# Patient Record
Sex: Male | Born: 1970 | Race: White | Hispanic: No | Marital: Single | Smoking: Never smoker
Health system: Southern US, Community
[De-identification: ages and names within clinical notes are randomized; demographics above are authoritative.]

## PROBLEM LIST (undated history)

## (undated) DIAGNOSIS — J45909 Unspecified asthma, uncomplicated: Secondary | ICD-10-CM

## (undated) HISTORY — PX: LITHOTRIPSY: SUR834

## (undated) HISTORY — PX: CARDIAC VALVE SURGERY: SHX40

## (undated) HISTORY — PX: TONSILLECTOMY: SUR1361

---

## 2002-02-19 ENCOUNTER — Encounter: Payer: Self-pay | Admitting: Emergency Medicine

## 2002-02-19 ENCOUNTER — Emergency Department (HOSPITAL_COMMUNITY): Admission: EM | Admit: 2002-02-19 | Discharge: 2002-02-19 | Payer: Self-pay | Admitting: Emergency Medicine

## 2003-11-02 ENCOUNTER — Emergency Department (HOSPITAL_COMMUNITY): Admission: EM | Admit: 2003-11-02 | Discharge: 2003-11-02 | Payer: Self-pay

## 2003-11-03 ENCOUNTER — Emergency Department (HOSPITAL_COMMUNITY): Admission: EM | Admit: 2003-11-03 | Discharge: 2003-11-03 | Payer: Self-pay | Admitting: Emergency Medicine

## 2010-05-01 ENCOUNTER — Ambulatory Visit: Payer: Self-pay | Admitting: Family Medicine

## 2010-05-01 DIAGNOSIS — R011 Cardiac murmur, unspecified: Secondary | ICD-10-CM

## 2010-05-01 DIAGNOSIS — I38 Endocarditis, valve unspecified: Secondary | ICD-10-CM | POA: Insufficient documentation

## 2010-05-01 DIAGNOSIS — E669 Obesity, unspecified: Secondary | ICD-10-CM

## 2010-05-01 LAB — CONVERTED CEMR LAB
ALT: 31 units/L (ref 0–53)
AST: 23 units/L (ref 0–37)
Albumin: 3.9 g/dL (ref 3.5–5.2)
Alkaline Phosphatase: 72 units/L (ref 39–117)
BUN: 15 mg/dL (ref 6–23)
CO2: 26 meq/L (ref 19–32)
Calcium: 9.2 mg/dL (ref 8.4–10.5)
Chloride: 103 meq/L (ref 96–112)
Cholesterol: 206 mg/dL — ABNORMAL HIGH (ref 0–200)
Creatinine, Ser: 1.11 mg/dL (ref 0.40–1.50)
Glucose, Bld: 92 mg/dL (ref 70–99)
HCT: 44.2 % (ref 39.0–52.0)
HDL: 34 mg/dL — ABNORMAL LOW (ref >39)
Hemoglobin: 14.4 g/dL (ref 13.0–17.0)
LDL Cholesterol: 149 mg/dL — ABNORMAL HIGH (ref 0–99)
MCHC: 32.6 g/dL (ref 30.0–36.0)
MCV: 84.8 fL (ref 78.0–100.0)
Platelets: 227 10*3/uL (ref 150–400)
Potassium: 4.2 meq/L (ref 3.5–5.3)
RBC: 5.21 M/uL (ref 4.22–5.81)
RDW: 13.5 % (ref 11.5–15.5)
Sodium: 139 meq/L (ref 135–145)
Total Bilirubin: 0.4 mg/dL (ref 0.3–1.2)
Total CHOL/HDL Ratio: 6.1
Total Protein: 7 g/dL (ref 6.0–8.3)
Triglycerides: 117 mg/dL (ref <150)
VLDL: 23 mg/dL (ref 0–40)
WBC: 7 10*3/uL (ref 4.0–10.5)

## 2010-05-02 ENCOUNTER — Encounter: Payer: Self-pay | Admitting: Family Medicine

## 2010-05-10 ENCOUNTER — Encounter: Payer: Self-pay | Admitting: Family Medicine

## 2010-05-10 ENCOUNTER — Ambulatory Visit: Payer: Self-pay | Admitting: Cardiology

## 2010-05-10 ENCOUNTER — Encounter (INDEPENDENT_AMBULATORY_CARE_PROVIDER_SITE_OTHER): Payer: Self-pay | Admitting: Diagnostic Radiology

## 2010-05-10 ENCOUNTER — Ambulatory Visit (HOSPITAL_COMMUNITY): Admission: RE | Admit: 2010-05-10 | Discharge: 2010-05-10 | Payer: Self-pay | Admitting: Family Medicine

## 2010-05-21 ENCOUNTER — Encounter: Payer: Self-pay | Admitting: Family Medicine

## 2010-09-27 NOTE — Assessment & Plan Note (Signed)
Summary: NP,tcb   Vital Signs:  Patient profile:   40 year old male Height:      67.25 inches Weight:      303 pounds BMI:     47.27 Temp:     98.3 degrees F oral Pulse rate:   101 / minute BP sitting:   132 / 87  (left arm) Cuff size:   large  Vitals Entered By: Tessie Fass CMA (May 01, 2010 10:00 AM) CC: NEW PT Is Patient Diabetic? No Pain Assessment Patient in pain? no        CC:  NEW PT.  History of Present Illness: Patient is new to our office.  He is here with his stepmother, for evaluation prior to dental extraction of L upper molar.  He has history of cardiac valvular surgery at age 22, has not required further followup and needs note for dentist to advise on prophylaxis for endocarditis before dental procedure.  No other problems or concerns.   Denies chest pain,dyspnea, cough, swelling in legs.  Denies weight changes, fevers chills or sweats.    Has been relatively well.   His dentist is [facility], [address], [address].     Habits & Providers  Alcohol-Tobacco-Diet     Tobacco Status: never  Past History:  Past Medical History: "Heart murmur" from birth/infancy.  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   Past Surgical History: Heart valve surgery at age 79; unclear diagnosis or procedure, nor where surgery was done.   Family History: Father with DM and HTN.  Mother died of unspecified cancer.   Social History: 5-8 yrs of education.  LIves with father, step-mother.  Biological mother died of unknown cancer.  Stays at home and watches TV.  Very little structure in his life.  Never a smoker, never alcohol or drugs. Smoking Status:  never  Physical Exam  General:  Pleasant, no apparent distress. Obese-appearing  Eyes:  clear sclerae Mouth:  clear oropharynx.  MMM Neck:  necksupple. no JVD Lungs:  Normal respiratory effort, chest expands symmetrically. Lungs are clear to auscultation, no crackles or  wheezes. Heart:  regular S1S2, has loud systolic M heard throughout Abdomen:  obese, no fluid wave.  Nontender, nondistended.  Pulses:  palpable dp pulses bilat.  Extremities:  no ankle edema.    Impression & Recommendations:  Problem # 1:  VALVULAR HEART DISEASE (ICD-424.90) Unspecified valvular heart disease. murmur is heard on exam. Will order ECHO for evaluation, and will send Rx for prophylaxis with amoxicillin.  Form from [facility] completed and given back to patient's step=mother.  Orders: Comp Met-[facility] ([id number]) 2 D Echo (2 D Echo)  Problem # 2:  OBESITY, UNSPECIFIED (ICD-278.00) Screening for lipid disorders in this obese patient.  Orders: Comp Met-[facility] [phone]) CBC-[facility] (78469) Lipid-[facility] ([id number])  Complete Medication List: 1)  Amoxicillin 500 Mg Caps (Amoxicillin) .... Sig: take 4 capsules by mouth one-time only, about 30 to 60 minutes before dental extraction.  Patient Instructions: 1)  It was a pleasure to see you today.  2)  Because of your history of valvular heart surgery, I am recommending you to take Amoxicillin 2g, one-time only before your dental procedure (30 to 60 minutes before).   You do not need to take more than the one dose. 3)  I am ordering an ECHO of your heart, to assess the murmur. 4)  I would like to see you after the ECHO is done and  I have a result.  5)  I will contact you with the results of your labwork, which we are drawing today.  Prescriptions: AMOXICILLIN 500 MG CAPS (AMOXICILLIN) SIG: Take 4 capsules by mouth one-time only, about 30 to 60 minutes before dental extraction.  #4 x 1   Entered and Authorized by:   Paula Compton MD   Signed by:   Paula Compton MD on 05/01/2010   Method used:   Electronically to        [facility]. [phone]* (retail)       [address]       [place], [place]  [address]       Ph: [phone]       Fax: [phone]   RxID:   [id number]

## 2010-09-27 NOTE — Letter (Signed)
Summary: Generic Letter  [facility]  [address]   [place], [place] [address]   Phone: [phone]  Fax: [phone]    05/02/2010  Ronald [name] [address] [place], [place]  [address]  Dear Mr. Colbaugh,    I write with results of the lab tests we did at your visit this week.  The tests were unremarkable, with the only abnormality being a very mildly elevated LDL "bad" cholesterol level.  This does not need to be treated with medication.  I advise a low-cholesterol, low-fat diet.  Please call with any questions or concerns.     Sincerely,   Paula Compton MD

## 2010-09-27 NOTE — Consult Note (Signed)
Summary: [facility]  [facility]   Imported By: Clydell Hakim 05/17/2010 15:19:56  _____________________________________________________________________  External Attachment:    Type:   Image     Comment:   External Document

## 2010-09-27 NOTE — Letter (Signed)
Summary: Generic Letter  [facility]  [address]   [place], [place] [address]   Phone: [phone]  Fax: [phone]    05/21/2010  Ronald [name] [address] [place], [place]  [address]  Dear [name],   I hope this letter finds you well.  I write because I received the result of the echocardiography.  The results are normal; based on the results, you do not require antibiotics before dental procedures.   Please feel free to contact me if you have any questions or concerns.     Sincerely,   Paula Compton MD  Appended Document: Generic Letter mailed.

## 2010-10-19 ENCOUNTER — Encounter: Payer: Self-pay | Admitting: *Deleted

## 2012-02-02 ENCOUNTER — Emergency Department (HOSPITAL_BASED_OUTPATIENT_CLINIC_OR_DEPARTMENT_OTHER)
Admission: EM | Admit: 2012-02-02 | Discharge: 2012-02-02 | Disposition: A | Discharge status: Home / Self Care | Payer: Medicare Other | Attending: Emergency Medicine | Admitting: Emergency Medicine

## 2012-02-02 ENCOUNTER — Encounter (HOSPITAL_BASED_OUTPATIENT_CLINIC_OR_DEPARTMENT_OTHER): Payer: Self-pay | Admitting: *Deleted

## 2012-02-02 DIAGNOSIS — T148XXA Other injury of unspecified body region, initial encounter: Secondary | ICD-10-CM

## 2012-02-02 DIAGNOSIS — R109 Unspecified abdominal pain: Secondary | ICD-10-CM | POA: Insufficient documentation

## 2012-02-02 HISTORY — DX: Unspecified asthma, uncomplicated: J45.909

## 2012-02-02 LAB — COMPREHENSIVE METABOLIC PANEL
BUN: 17 mg/dL (ref 6–23)
CO2: 29 mEq/L (ref 19–32)
Calcium: 10 mg/dL (ref 8.4–10.5)
Chloride: 103 mEq/L (ref 96–112)
Creatinine, Ser: 1.1 mg/dL (ref 0.50–1.35)
GFR calc Af Amer: >90 mL/min (ref >90)
GFR calc non Af Amer: 83 mL/min — ABNORMAL LOW (ref >90)
Glucose, Bld: 100 mg/dL — ABNORMAL HIGH (ref 70–99)
Total Bilirubin: 0.2 mg/dL — ABNORMAL LOW (ref 0.3–1.2)

## 2012-02-02 LAB — URINALYSIS, ROUTINE W REFLEX MICROSCOPIC
Hgb urine dipstick: NEGATIVE
Nitrite: NEGATIVE
Protein, ur: NEGATIVE mg/dL
Specific Gravity, Urine: 1.024 (ref 1.005–1.030)
Urobilinogen, UA: 0.2 mg/dL (ref 0.0–1.0)

## 2012-02-02 LAB — CBC
HCT: 44.6 % (ref 39.0–52.0)
Hemoglobin: 15.2 g/dL (ref 13.0–17.0)
MCV: 80.5 fL (ref 78.0–100.0)
WBC: 9.9 10*3/uL (ref 4.0–10.5)

## 2012-02-02 LAB — DIFFERENTIAL
Eosinophils Relative: 3 % (ref 0–5)
Lymphocytes Relative: 24 % (ref 12–46)
Monocytes Absolute: 0.7 10*3/uL (ref 0.1–1.0)
Monocytes Relative: 7 % (ref 3–12)
Neutro Abs: 6.5 10*3/uL (ref 1.7–7.7)

## 2012-02-02 LAB — LIPASE, BLOOD: Lipase: 12 U/L (ref 11–59)

## 2012-02-02 MED ORDER — HYDROCODONE-ACETAMINOPHEN 5-325 MG PO TABS: 2.0000 | ORAL_TABLET | ORAL | Status: AC | PRN

## 2012-02-02 MED ORDER — CYCLOBENZAPRINE HCL 10 MG PO TABS: 10.0000 mg | ORAL_TABLET | Freq: Two times a day (BID) | ORAL | Status: AC | PRN

## 2012-02-02 MED ORDER — IBUPROFEN 800 MG PO TABS: 800.0000 mg | ORAL_TABLET | Freq: Three times a day (TID) | ORAL | Status: AC

## 2012-02-02 NOTE — Discharge Instructions (Signed)
Muscle Strain A muscle strain, or pulled muscle, occurs when a muscle is over-stretched. A small number of muscle fibers may also be torn. This is especially common in athletes. This happens when a sudden violent force placed on a muscle pushes it past its capacity. Usually, recovery from a pulled muscle takes 1 to 2 weeks. But complete healing will take 5 to 6 weeks. There are millions of muscle fibers. Following injury, your body will usually return to normal quickly. HOME CARE INSTRUCTIONS   While awake, apply ice to the sore muscle for 15 to 20 minutes each hour for the first 2 days. Put ice in a plastic bag and place a towel between the bag of ice and your skin.   Do not use the pulled muscle for several days. Do not use the muscle if you have pain.   You may wrap the injured area with an elastic bandage for comfort. Be careful not to bind it too tightly. This may interfere with blood circulation.   Only take over-the-counter or prescription medicines for pain, discomfort, or fever as directed by your caregiver. Do not use aspirin as this will increase bleeding (bruising) at injury site.   Warming up before exercise helps prevent muscle strains.  Go to www.goodrx.com to look up your medications. This will give you a list of where you can find your prescriptions at the most affordable prices.   Call [facility]  [phone]  If you have no primary doctor, here are some resources that may be helpful:  Medicaid-accepting [facility] Providers:   - [facility]- [address]      [phone]      Mon-Fri 9am-7pm, Sat 9am-1pm   - [facility]- [address] [place]      [phone]   - [facility]- [address], [address] [place]      [phone]   [facility]- [address]      [phone]   - Renaye Rakers- [address] [place], [address]      [phone]      Only accepts [place]  patients       after they have her name applied to their card   Self Pay (no insurance) in [place]:   - Sickle Cell Patients: Dr Willey Blade, [facility]      [address] [place]      [phone]   - [facility] [phone]   - Physician Referral Service- [phone]   - [facility]- [address] [place]      [phone]   [facility] [place]- [address]   - [facility]- see information above      (Speak to [employer] if you do not have insurance)   - [facility]- [address] [place]      [phone]   - [facility] [place]- [address]      [phone]   - [facility]- [address]      [phone]   - Dr Julio Sicks-  [address], [place]      [phone]   - [facility]- [address]      [phone]   - [facility]- [address]      [phone]  Also [address]      [phone]   - [facility]- [address]      [phone]      1st and 3rd Saturday every month, 10am-1pm    Other agencies that provide inexpensive medical care:     [facility]  [phone]    [facility]  [phone]    [facility]  [phone]    [facility]  [phone] [address] [place] [address]    [facility]  [phone]    [facility]  [phone] [facility] [phone]   [address] [place], [place] [address]  Chronic Pain Problems Contact [facility]  [phone] Patients need to be referred by their primary care doctor.  [facility]  [facility]     [employer]                          [facility] Dept. [address] [place]                       [address]      [address] [place] [address]   [phone] (After Hours)  General Information: Finding a  doctor when you do not have health insurance can be tricky. Although you are not limited by an insurance plan, you are of course limited by her finances and how much but he can pay out of pocket.  What are your options if you don't have health insurance?   1) Find a Librarian, academic and Pay Out of Pocket Although you won't have to find out who is covered by your insurance plan, it is a good idea to ask around and get recommendations. You will then need to call the office and see if the doctor you have chosen will accept you as a new patient and what types of options they offer for patients who are self-pay. Some doctors offer discounts or will set up payment plans for their patients who do not have insurance, but you will need to ask so you aren't surprised when you get to your appointment.  2) Contact Your Local Health Department Not all health departments have doctors that can see patients for sick visits, but many do, so it is worth a call to see if yours does. If you don't know where your local health department is, you can check in your phone book. The CDC also has a tool to help you locate your state's health department, and many state websites also have listings of all of their local health departments.  3) Find a Walk-in Clinic If your illness is not likely to be very severe or complicated, you may want to try a walk in clinic. These are popping up all over the country in pharmacies, drugstores, and shopping centers. They're usually staffed by nurse practitioners or physician assistants that have been trained to treat common illnesses and complaints. They're usually fairly quick and inexpensive. However, if you have serious medical issues or chronic medical problems, these are probably not your best option   SEEK MEDICAL CARE IF:  There is increased pain or swelling in the affected area. MAKE SURE YOU:   Understand these instructions.   Will watch your condition.   Will get help right away if you  are not doing well or get worse.  Document Released: 08/12/2005 Document Revised: 08/01/2011 Document Reviewed: 03/11/2007 [facility] Patient Information 2012 [place], [place].

## 2012-02-02 NOTE — ED Provider Notes (Signed)
History   This chart was scribed for Elson Areas, PA by Melba Coon. The patient was seen in room MH08/MH08 and the patient's care was started at 6:00PM.    CSN: [id number]  Arrival date & time 02/02/12  1708   First MD Initiated Contact with Patient 02/02/12 1749      Chief Complaint  Patient presents with  . Flank Pain    (Consider location/radiation/quality/duration/timing/severity/associated sxs/prior treatment) HPI GAVAN NORDBY is a 41 y.o. male who presents to the Emergency Department complaining of constant, modrate to severe right flank pain with an onset 2 months ago. No trauma or falls to the affected area. No pain during inhalation. Physical exertion and movement aggravates the pain. Nausea and vomit present. No HA, fever, neck pain, sore throat, rash, back pain, CP, SOB, abd pain, diarrhea, dysuria, or extremity pain, edema, weakness, numbness, or tingling. Family hx of COPD. No known allergies. No other pertinent medical symptoms.   Past Medical History  Diagnosis Date  . Asthma     Past Surgical History  Procedure Date  . Cardiac valve surgery   . Tonsillectomy     No family history on file.  History  Substance Use Topics  . Smoking status: Never Smoker   . Smokeless tobacco: Never Used  . Alcohol Use: No      Review of Systems 10 Systems reviewed and all are negative for acute change except as noted in the HPI.   Allergies  Review of patient's allergies indicates no known allergies.  Home Medications  No current outpatient prescriptions on file.  BP 140/89  Pulse 99  Temp 98.1 F (36.7 C)  Resp 20  Ht 5\' 7"  (1.702 m)  Wt 314 lb (142.429 kg)  BMI 49.18 kg/m2  SpO2 98%  Physical Exam  Nursing note and vitals reviewed. Constitutional:       Awake, alert, nontoxic appearance.  HENT:  Head: Atraumatic.  Eyes: Right eye exhibits no discharge. Left eye exhibits no discharge.  Neck: Neck supple.  Pulmonary/Chest: Effort normal. He  exhibits no tenderness.  Abdominal: Soft. There is no tenderness (rt lateral; abd wall). There is no rebound.       RUQ non-tender  Musculoskeletal: He exhibits no tenderness.       Baseline ROM, no obvious new focal weakness. Lumbar spine non-tender.  Neurological:       Mental status and motor strength appears baseline for patient and situation.  Skin: No rash noted.  Psychiatric: He has a normal mood and affect.    ED Course  Procedures (including critical care time)  DIAGNOSTIC STUDIES: Oxygen Saturation is 98% on room air, normal by my interpretation.    COORDINATION OF CARE:  6:05PM - PA will order UA and blood w/u for the pt.  Labs Reviewed  COMPREHENSIVE METABOLIC PANEL - Abnormal; Notable for the following:    Glucose, Bld 100 (*)    Total Bilirubin 0.2 (*)    GFR calc non Af Amer 83 (*)    All other components within normal limits  CBC  DIFFERENTIAL  URINALYSIS, ROUTINE W REFLEX MICROSCOPIC  LIPASE, BLOOD   No results found.   No diagnosis found.    MDM   Results for orders placed during the hospital encounter of 02/02/12  CBC      Component Value Range   WBC 9.9  4.0 - 10.5 (K/uL)   RBC 5.54  4.22 - 5.81 (MIL/uL)   Hemoglobin 15.2  13.0 - 17.0 (  g/dL)   HCT 78.2  95.6 - 21.3 (%)   MCV 80.5  78.0 - 100.0 (fL)   MCH 27.4  26.0 - 34.0 (pg)   MCHC 34.1  30.0 - 36.0 (g/dL)   RDW 08.6  57.8 - 46.9 (%)   Platelets 220  150 - 400 (K/uL)  DIFFERENTIAL      Component Value Range   Neutrophils Relative 66  43 - 77 (%)   Neutro Abs 6.5  1.7 - 7.7 (K/uL)   Lymphocytes Relative 24  12 - 46 (%)   Lymphs Abs 2.4  0.7 - 4.0 (K/uL)   Monocytes Relative 7  3 - 12 (%)   Monocytes Absolute 0.7  0.1 - 1.0 (K/uL)   Eosinophils Relative 3  0 - 5 (%)   Eosinophils Absolute 0.3  0.0 - 0.7 (K/uL)   Basophils Relative 0  0 - 1 (%)   Basophils Absolute 0.0  0.0 - 0.1 (K/uL)  COMPREHENSIVE METABOLIC PANEL      Component Value Range   Sodium 139  135 - 145 (mEq/L)    Potassium 4.0  3.5 - 5.1 (mEq/L)   Chloride 103  96 - 112 (mEq/L)   CO2 29  19 - 32 (mEq/L)   Glucose, Bld 100 (*) 70 - 99 (mg/dL)   BUN 17  6 - 23 (mg/dL)   Creatinine, Ser 6.29  0.50 - 1.35 (mg/dL)   Calcium 52.8  8.4 - 10.5 (mg/dL)   Total Protein 8.1  6.0 - 8.3 (g/dL)   Albumin 3.9  3.5 - 5.2 (g/dL)   AST 29  0 - 37 (U/L)   ALT 47  0 - 53 (U/L)   Alkaline Phosphatase 85  39 - 117 (U/L)   Total Bilirubin 0.2 (*) 0.3 - 1.2 (mg/dL)   GFR calc non Af Amer 83 (*) >90 (mL/min)   GFR calc Af Amer >90  >90 (mL/min)  URINALYSIS, ROUTINE W REFLEX MICROSCOPIC      Component Value Range   Color, Urine YELLOW  YELLOW    APPearance CLEAR  CLEAR    Specific Gravity, Urine 1.024  1.005 - 1.030    pH 5.5  5.0 - 8.0    Glucose, UA NEGATIVE  NEGATIVE (mg/dL)   Hgb urine dipstick NEGATIVE  NEGATIVE    Bilirubin Urine NEGATIVE  NEGATIVE    Ketones, ur NEGATIVE  NEGATIVE (mg/dL)   Protein, ur NEGATIVE  NEGATIVE (mg/dL)   Urobilinogen, UA 0.2  0.0 - 1.0 (mg/dL)   Nitrite NEGATIVE  NEGATIVE    Leukocytes, UA NEGATIVE  NEGATIVE   LIPASE, BLOOD      Component Value Range   Lipase 12  11 - 59 (U/L)   No results found.  Labs are normal.   Pt given number for primary MD. Referral.   I suspect muscular pain.   I will treat with flexeril and ibuprofen  I personally performed the services in this documentation, which was scribed in my presence.  The recorded information has been reviewed and considered.   Barnet Pall.       Lonia Skinner [place], [place] 02/02/12 2026

## 2012-02-02 NOTE — ED Notes (Signed)
Pt reports right side pain since march- worse with movement- family member encouraged pt to get checked out today due to episode of pain and nausea

## 2012-02-05 NOTE — ED Provider Notes (Signed)
Medical screening examination/treatment/procedure(s) were performed by non-physician practitioner and as supervising physician I was immediately available for consultation/collaboration.  Charmaine Placido, MD 02/05/12 1759 

## 2014-08-15 ENCOUNTER — Emergency Department (HOSPITAL_COMMUNITY)
Admission: EM | Admit: 2014-08-15 | Discharge: 2014-08-16 | Disposition: A | Discharge status: Home / Self Care | Payer: Medicare Other | Attending: Emergency Medicine | Admitting: Emergency Medicine

## 2014-08-15 ENCOUNTER — Encounter (HOSPITAL_COMMUNITY): Payer: Self-pay | Admitting: Emergency Medicine

## 2014-08-15 DIAGNOSIS — R109 Unspecified abdominal pain: Secondary | ICD-10-CM

## 2014-08-15 DIAGNOSIS — N201 Calculus of ureter: Secondary | ICD-10-CM | POA: Diagnosis not present

## 2014-08-15 DIAGNOSIS — J45909 Unspecified asthma, uncomplicated: Secondary | ICD-10-CM | POA: Diagnosis not present

## 2014-08-15 DIAGNOSIS — Z87442 Personal history of urinary calculi: Secondary | ICD-10-CM | POA: Insufficient documentation

## 2014-08-15 MED ORDER — MORPHINE SULFATE 4 MG/ML IJ SOLN
4.0000 mg | Freq: Once | INTRAMUSCULAR | Status: AC
  Administered 2014-08-15: 4 mg via INTRAVENOUS
  Filled 2014-08-15: qty 1

## 2014-08-15 MED ORDER — ONDANSETRON HCL 4 MG/2ML IJ SOLN
4.0000 mg | Freq: Once | INTRAMUSCULAR | Status: AC
  Administered 2014-08-15: 4 mg via INTRAVENOUS
  Filled 2014-08-15: qty 2

## 2014-08-15 NOTE — ED Notes (Signed)
Patient his right flank pain radiates to his right lower abd. Reports having nausea and vomiting but reports nausea has improved.

## 2014-08-15 NOTE — ED Provider Notes (Signed)
CSN: [id number]     Arrival date & time 08/15/14  2251 History   First MD Initiated Contact with Patient 08/15/14 2306     Chief Complaint  Patient presents with  . Flank Pain     (Consider location/radiation/quality/duration/timing/severity/associated sxs/prior Treatment) HPI 43 yo male presents to the ER from home with complaint of right flank pain.  Pain came on acutely, sharp, and associated with n/v.  Pt reports h/o similar pain with kidney stone.  Pain is intermittent.   Past Medical History  Diagnosis Date  . Asthma    Past Surgical History  Procedure Laterality Date  . Cardiac valve surgery    . Tonsillectomy     History reviewed. No pertinent family history. History  Substance Use Topics  . Smoking status: Never Smoker   . Smokeless tobacco: Never Used  . Alcohol Use: No    Review of Systems   See History of Present Illness; otherwise all other systems are reviewed and negative  Allergies  Review of patient's allergies indicates no known allergies.  Home Medications   Prior to Admission medications   Medication Sig Start Date End Date Taking? Authorizing Provider  ibuprofen (ADVIL,MOTRIN) 800 MG tablet Take 800 mg by mouth every 8 (eight) hours as needed for moderate pain.   Yes Historical Provider, MD   BP 130/82 mmHg  Pulse 95  Temp(Src) 97.9 F (36.6 C) (Oral)  SpO2 99% Physical Exam  Constitutional: He is oriented to person, place, and time. He appears well-developed and well-nourished.  HENT:  Head: Normocephalic and atraumatic.  Nose: Nose normal.  Mouth/Throat: Oropharynx is clear and moist.  Eyes: Conjunctivae and EOM are normal. Pupils are equal, round, and reactive to light.  Neck: Normal range of motion. Neck supple. No JVD present. No tracheal deviation present. No thyromegaly present.  Cardiovascular: Normal rate, regular rhythm, normal heart sounds and intact distal pulses.  Exam reveals no gallop and no friction rub.   No murmur  heard. Pulmonary/Chest: Effort normal and breath sounds normal. No stridor. No respiratory distress. He has no wheezes. He has no rales. He exhibits no tenderness.  Abdominal: Soft. Bowel sounds are normal. He exhibits no distension and no mass. There is no tenderness. There is no rebound and no guarding.  Mild right CVA tenderness  Musculoskeletal: Normal range of motion. He exhibits no edema or tenderness.  Lymphadenopathy:    He has no cervical adenopathy.  Neurological: He is alert and oriented to person, place, and time. He displays normal reflexes. He exhibits normal muscle tone. Coordination normal.  Skin: Skin is warm and dry. No rash noted. No erythema. No pallor.  Psychiatric: He has a normal mood and affect. His behavior is normal. Judgment and thought content normal.  Nursing note and vitals reviewed.   ED Course  Procedures (including critical care time) Labs Review Labs Reviewed  URINALYSIS, ROUTINE W REFLEX MICROSCOPIC   Results for orders placed or performed during the hospital encounter of 08/15/14  Urinalysis, Routine w reflex microscopic  Result Value Ref Range   Color, Urine YELLOW YELLOW   APPearance CLEAR CLEAR   Specific Gravity, Urine 1.023 1.005 - 1.030   pH 5.5 5.0 - 8.0   Glucose, UA NEGATIVE NEGATIVE mg/dL   Hgb urine dipstick SMALL (A) NEGATIVE   Bilirubin Urine NEGATIVE NEGATIVE   Ketones, ur NEGATIVE NEGATIVE mg/dL   Protein, ur NEGATIVE NEGATIVE mg/dL   Urobilinogen, UA 0.2 0.0 - 1.0 mg/dL   Nitrite NEGATIVE NEGATIVE  Leukocytes, UA NEGATIVE NEGATIVE  Urine microscopic-add on  Result Value Ref Range   WBC, UA 0-2 <3 WBC/hpf   RBC / HPF 3-6 <3 RBC/hpf   Bacteria, UA FEW (A) RARE   Urine-Other MUCOUS PRESENT   I-Stat Chem 8, ED  Result Value Ref Range   Sodium 140 135 - 145 mmol/L   Potassium 4.2 3.5 - 5.1 mmol/L   Chloride 105 96 - 112 mEq/L   BUN 17 6 - 23 mg/dL   Creatinine, Ser 8.111.30 0.50 - 1.35 mg/dL   Glucose, Bld 914126 (H) 70 - 99  mg/dL   Calcium, Ion 7.821.19 9.561.12 - 1.23 mmol/L   TCO2 24 0 - 100 mmol/L   Hemoglobin 14.6 13.0 - 17.0 g/dL   HCT 21.343.0 08.639.0 - 57.852.0 %   Ct Renal Stone Study  08/16/2014   CLINICAL DATA:  RIGHT flank pain radiating to lower abdomen. Nausea vomiting. Microscopic hematuria.  EXAM: CT ABDOMEN AND PELVIS WITHOUT CONTRAST  TECHNIQUE: Multidetector CT imaging of the abdomen and pelvis was performed following the standard protocol without IV contrast.  COMPARISON:  CT of the abdomen pelvis November 03, 2003  FINDINGS: Large body habitus results in overall noisy image quality.  LUNG BASES: Included view of the lung bases are clear. The visualized heart and pericardium are unremarkable. Partially imaged median sternotomy.  KIDNEYS/BLADDER: Kidneys are orthotopic, demonstrating normal size and morphology. Bilateral nephrolithiasis, including 14 x 13 mm RIGHT renal pelvis calculus, and 13 x 11 mm LEFT renal calculus with additional smaller calculi bilaterally. Similar fullness of the renal pelvis bilaterally. Mild RIGHT hydroureteronephrosis the level of the distal ureter where a 3 mm calculus is seen. Urinary bladder is partially distended, harboring no intravesicular calculi.  SOLID ORGANS: The liver, spleen, gallbladder, pancreas and adrenal glands are unremarkable for this non-contrast examination.  GASTROINTESTINAL TRACT: The stomach, small and large bowel are normal in course and caliber without inflammatory changes, the sensitivity may be decreased by lack of enteric contrast. Normal appendix.  PERITONEUM/RETROPERITONEUM: No intraperitoneal free fluid nor free air. Aortoiliac vessels are normal in course and caliber, mild calcific atherosclerosis. No lymphadenopathy by CT size criteria. Internal reproductive organs are unremarkable.  SOFT TISSUES/ OSSEOUS STRUCTURES: Nonsuspicious. Large RIGHT and small LEFT fat containing inguinal hernia. Scattered Schmorl's nodes.  IMPRESSION: Mild RIGHT hydroureteronephrosis to the  level of distal ureter where a 3 mm calculus is seen.  Residual bilateral nephrolithiasis measuring up to 14 mm on the RIGHT. Similar fullness of the renal pelvis, this may reflect extrarenal pelvises, UPJ obstruction, similar.   Electronically Signed   By: Awilda Metroourtnay  Bloomer   On: 08/16/2014 01:50     Imaging Review No results found.   EKG Interpretation None      MDM   Final diagnoses:  Right flank pain  Right ureteral stone   43 year old male with right flank pain, history of kidney stone pain appears to be renal colic-like.  Urine pending.  Plan for pain and nausea control.  As patient has prior history of kidney stone, will hold on imaging at this time if we get his pain under control.    Olivia Mackielga M Sabrinia Prien, MD 08/16/14 [phone]

## 2014-08-15 NOTE — ED Notes (Signed)
Pt states he started having R flank pain x 1 hour ago. Denies urinary symptoms. Alert and oriented.

## 2014-08-16 ENCOUNTER — Emergency Department (HOSPITAL_COMMUNITY): Payer: Medicare Other

## 2014-08-16 DIAGNOSIS — N201 Calculus of ureter: Secondary | ICD-10-CM | POA: Diagnosis not present

## 2014-08-16 LAB — CBC
HCT: 42.3 % (ref 39.0–52.0)
Hemoglobin: 13.7 g/dL (ref 13.0–17.0)
MCH: 27 pg (ref 26.0–34.0)
MCHC: 32.4 g/dL (ref 30.0–36.0)
MCV: 83.3 fL (ref 78.0–100.0)
PLATELETS: 257 10*3/uL (ref 150–400)
RBC: 5.08 MIL/uL (ref 4.22–5.81)
RDW: 13.1 % (ref 11.5–15.5)
WBC: 11.3 10*3/uL — ABNORMAL HIGH (ref 4.0–10.5)

## 2014-08-16 LAB — URINALYSIS, ROUTINE W REFLEX MICROSCOPIC
Bilirubin Urine: NEGATIVE
GLUCOSE, UA: NEGATIVE mg/dL
Ketones, ur: NEGATIVE mg/dL
LEUKOCYTES UA: NEGATIVE
Nitrite: NEGATIVE
Protein, ur: NEGATIVE mg/dL
SPECIFIC GRAVITY, URINE: 1.023 (ref 1.005–1.030)
UROBILINOGEN UA: 0.2 mg/dL (ref 0.0–1.0)
pH: 5.5 (ref 5.0–8.0)

## 2014-08-16 LAB — I-STAT CHEM 8, ED
BUN: 17 mg/dL (ref 6–23)
CALCIUM ION: 1.19 mmol/L (ref 1.12–1.23)
CREATININE: 1.3 mg/dL (ref 0.50–1.35)
Chloride: 105 mEq/L (ref 96–112)
Glucose, Bld: 126 mg/dL — ABNORMAL HIGH (ref 70–99)
HCT: 43 % (ref 39.0–52.0)
HEMOGLOBIN: 14.6 g/dL (ref 13.0–17.0)
Potassium: 4.2 mmol/L (ref 3.5–5.1)
SODIUM: 140 mmol/L (ref 135–145)
TCO2: 24 mmol/L (ref 0–100)

## 2014-08-16 LAB — URINE MICROSCOPIC-ADD ON

## 2014-08-16 MED ORDER — MORPHINE SULFATE 4 MG/ML IJ SOLN
4.0000 mg | Freq: Once | INTRAMUSCULAR | Status: AC
  Administered 2014-08-16: 4 mg via INTRAVENOUS
  Filled 2014-08-16: qty 1

## 2014-08-16 MED ORDER — ONDANSETRON 8 MG PO TBDP: 8.0000 mg | ORAL_TABLET | Freq: Three times a day (TID) | ORAL | Status: DC | PRN

## 2014-08-16 MED ORDER — OXYCODONE-ACETAMINOPHEN 5-325 MG PO TABS: 2.0000 | ORAL_TABLET | ORAL | Status: DC | PRN

## 2014-08-16 NOTE — Discharge Instructions (Signed)
You have a 3 mm stone on the right about to pass into your bladder.  This should pass on its own.  Drink plenty of fluids.  Take pain and nausea medications as prescribed.  Return to the ER for worsening pain, or nausea despite medications, fever, or other new concerning symptoms.  Follow up with urology as listed above.  Your CT scan  also shows other large kidney stones in both kidneys that may cause problems in the future.  If you have problems with kidney stones either with your current stone or with future stones, the urology group prefers that [facility].  They are better equipped to handle complications at the [facility].     Dietary Guidelines to Help Prevent Kidney Stones Your risk of kidney stones can be decreased by adjusting the foods you eat. The most important thing you can do is drink enough fluid. You should drink enough fluid to keep your urine clear or pale yellow. The following guidelines provide specific information for the type of kidney stone you have had. GUIDELINES ACCORDING TO TYPE OF KIDNEY STONE Calcium Oxalate Kidney Stones  Reduce the amount of salt you eat. Foods that have a lot of salt cause your body to release excess calcium into your urine. The excess calcium can combine with a substance called oxalate to form kidney stones.  Reduce the amount of animal protein you eat if the amount you eat is excessive. Animal protein causes your body to release excess calcium into your urine. Ask your dietitian how much protein from animal sources you should be eating.  Avoid foods that are high in oxalates. If you take vitamins, they should have less than 500 mg of vitamin C. Your body turns vitamin C into oxalates. You do not need to avoid fruits and vegetables high in vitamin C. Calcium Phosphate Kidney Stones  Reduce the amount of salt you eat to help prevent the release of excess calcium into your urine.  Reduce the amount of  animal protein you eat if the amount you eat is excessive. Animal protein causes your body to release excess calcium into your urine. Ask your dietitian how much protein from animal sources you should be eating.  Get enough calcium from food or take a calcium supplement (ask your dietitian for recommendations). Food sources of calcium that do not increase your risk of kidney stones include:  Broccoli.  Dairy products, such as cheese and yogurt.  Pudding. Uric Acid Kidney Stones  Do not have more than 6 oz of animal protein per day. FOOD SOURCES Animal Protein Sources  Meat (all types).  Poultry.  Eggs.  Fish, seafood. Foods High in MirantSalt  Salt seasonings.  Soy sauce.  Teriyaki sauce.  Cured and processed meats.  Salted crackers and snack foods.  Fast food.  Canned soups and most canned foods. Foods High in Oxalates  Grains:  Amaranth.  Barley.  Grits.  Wheat germ.  Bran.  Buckwheat flour.  All bran cereals.  Pretzels.  Whole wheat bread.  Vegetables:  Beans (wax).  Beets and beet greens.  Collard greens.  Eggplant.  Escarole.  Leeks.  Okra.  Parsley.  Rutabagas.  Spinach.  Swiss chard.  Tomato paste.  Fried potatoes.  Sweet potatoes.  Fruits:  Red currants.  Figs.  Kiwi.  Rhubarb.  Meat and Other Protein Sources:  Beans (dried).  Soy burgers and other soybean products.  Miso.  Nuts (peanuts,  almonds, pecans, cashews, hazelnuts).  Nut butters.  Sesame seeds and tahini (paste made of sesame seeds).  Poppy seeds.  Beverages:  Chocolate drink mixes.  Soy milk.  Instant iced tea.  Juices made from high-oxalate fruits or vegetables.  Other:  Carob.  Chocolate.  Fruitcake.  Marmalades. Document Released: 12/07/2010 Document Revised: 08/17/2013 Document Reviewed: 07/09/2013 [facility] Patient Information 2015 [place], [place]. This information is not intended to replace advice given to you by  your health care provider. Make sure you discuss any questions you have with your health care provider.  Kidney Stones Kidney stones (urolithiasis) are solid masses that form inside your kidneys. The intense pain is caused by the stone moving through the kidney, ureter, bladder, and urethra (urinary tract). When the stone moves, the ureter starts to spasm around the stone. The stone is usually passed in your pee (urine).  HOME CARE  Drink enough fluids to keep your pee clear or pale yellow. This helps to get the stone out.  Strain all pee through the provided strainer. Do not pee without peeing through the strainer, not even once. If you pee the stone out, catch it in the strainer. The stone may be as small as a grain of salt. Take this to your doctor. This will help your doctor figure out what you can do to try to prevent more kidney stones.  Only take medicine as told by your doctor.  Follow up with your doctor as told.  Get follow-up X-rays as told by your doctor. GET HELP IF: You have pain that gets worse even if you have been taking pain medicine. GET HELP RIGHT AWAY IF:   Your pain does not get better with medicine.  You have a fever or shaking chills.  Your pain increases and gets worse over 18 hours.  You have new belly (abdominal) pain.  You feel faint or pass out.  You are unable to pee. MAKE SURE YOU:   Understand these instructions.  Will watch your condition.  Will get help right away if you are not doing well or get worse. Document Released: 01/29/2008 Document Revised: 04/14/2013 Document Reviewed: 01/13/2013 [facility] Patient Information 2015 [place], [place]. This information is not intended to replace advice given to you by your health care provider. Make sure you discuss any questions you have with your health care provider.

## 2014-08-16 NOTE — ED Notes (Signed)
Pt transported to CT ?

## 2015-02-23 ENCOUNTER — Emergency Department (HOSPITAL_COMMUNITY): Payer: Medicare (Managed Care)

## 2015-02-23 ENCOUNTER — Emergency Department (HOSPITAL_COMMUNITY)
Admission: EM | Admit: 2015-02-23 | Discharge: 2015-02-24 | Disposition: A | Discharge status: Home / Self Care | Payer: Medicare (Managed Care) | Attending: Emergency Medicine | Admitting: Emergency Medicine

## 2015-02-23 ENCOUNTER — Encounter (HOSPITAL_COMMUNITY): Payer: Self-pay | Admitting: *Deleted

## 2015-02-23 DIAGNOSIS — R7989 Other specified abnormal findings of blood chemistry: Secondary | ICD-10-CM | POA: Diagnosis not present

## 2015-02-23 DIAGNOSIS — N200 Calculus of kidney: Secondary | ICD-10-CM

## 2015-02-23 DIAGNOSIS — R1013 Epigastric pain: Secondary | ICD-10-CM | POA: Diagnosis present

## 2015-02-23 DIAGNOSIS — N508 Other specified disorders of male genital organs: Secondary | ICD-10-CM | POA: Insufficient documentation

## 2015-02-23 DIAGNOSIS — J45909 Unspecified asthma, uncomplicated: Secondary | ICD-10-CM | POA: Insufficient documentation

## 2015-02-23 LAB — COMPREHENSIVE METABOLIC PANEL
ALBUMIN: 3.8 g/dL (ref 3.5–5.0)
ALK PHOS: 71 U/L (ref 38–126)
ALT: 34 U/L (ref 17–63)
AST: 24 U/L (ref 15–41)
Anion gap: 8 (ref 5–15)
BUN: 20 mg/dL (ref 6–20)
CALCIUM: 9.1 mg/dL (ref 8.9–10.3)
CHLORIDE: 105 mmol/L (ref 101–111)
CO2: 26 mmol/L (ref 22–32)
CREATININE: 1.53 mg/dL — AB (ref 0.61–1.24)
GFR calc Af Amer: >60 mL/min (ref >60)
GFR calc non Af Amer: 55 mL/min — ABNORMAL LOW (ref >60)
GLUCOSE: 126 mg/dL — AB (ref 65–99)
Potassium: 4.3 mmol/L (ref 3.5–5.1)
Sodium: 139 mmol/L (ref 135–145)
TOTAL PROTEIN: 7.7 g/dL (ref 6.5–8.1)
Total Bilirubin: 0.3 mg/dL (ref 0.3–1.2)

## 2015-02-23 LAB — URINALYSIS, ROUTINE W REFLEX MICROSCOPIC
Bilirubin Urine: NEGATIVE
Glucose, UA: NEGATIVE mg/dL
Ketones, ur: NEGATIVE mg/dL
Leukocytes, UA: NEGATIVE
Nitrite: NEGATIVE
Protein, ur: 30 mg/dL — AB
Specific Gravity, Urine: 1.024 (ref 1.005–1.030)
Urobilinogen, UA: 0.2 mg/dL (ref 0.0–1.0)
pH: 7 (ref 5.0–8.0)

## 2015-02-23 LAB — CBC WITH DIFFERENTIAL/PLATELET
Basophils Absolute: 0 K/uL (ref 0.0–0.1)
Basophils Relative: 0 % (ref 0–1)
Eosinophils Absolute: 0.1 K/uL (ref 0.0–0.7)
Eosinophils Relative: 0 % (ref 0–5)
HCT: 42.6 % (ref 39.0–52.0)
Hemoglobin: 14 g/dL (ref 13.0–17.0)
Lymphocytes Relative: 11 % — ABNORMAL LOW (ref 12–46)
Lymphs Abs: 1.3 K/uL (ref 0.7–4.0)
MCH: 26.8 pg (ref 26.0–34.0)
MCHC: 32.9 g/dL (ref 30.0–36.0)
MCV: 81.6 fL (ref 78.0–100.0)
Monocytes Absolute: 0.7 K/uL (ref 0.1–1.0)
Monocytes Relative: 6 % (ref 3–12)
Neutro Abs: 10.3 K/uL — ABNORMAL HIGH (ref 1.7–7.7)
Neutrophils Relative %: 83 % — ABNORMAL HIGH (ref 43–77)
Platelets: 243 K/uL (ref 150–400)
RBC: 5.22 MIL/uL (ref 4.22–5.81)
RDW: 13.2 % (ref 11.5–15.5)
WBC: 12.4 K/uL — ABNORMAL HIGH (ref 4.0–10.5)

## 2015-02-23 LAB — URINE MICROSCOPIC-ADD ON

## 2015-02-23 LAB — LIPASE, BLOOD: Lipase: 10 U/L — ABNORMAL LOW (ref 22–51)

## 2015-02-23 MED ORDER — ONDANSETRON HCL 4 MG/2ML IJ SOLN
4.0000 mg | Freq: Once | INTRAMUSCULAR | Status: AC
  Administered 2015-02-23: 4 mg via INTRAVENOUS
  Filled 2015-02-23: qty 2

## 2015-02-23 MED ORDER — MORPHINE SULFATE 4 MG/ML IJ SOLN
4.0000 mg | Freq: Once | INTRAMUSCULAR | Status: AC
  Administered 2015-02-23: 4 mg via INTRAVENOUS
  Filled 2015-02-23: qty 1

## 2015-02-23 MED ORDER — SODIUM CHLORIDE 0.9 % IV BOLUS (SEPSIS)
1000.0000 mL | Freq: Once | INTRAVENOUS | Status: AC
  Administered 2015-02-23: 1000 mL via INTRAVENOUS

## 2015-02-23 MED ORDER — MORPHINE SULFATE 4 MG/ML IJ SOLN
4.0000 mg | Freq: Once | INTRAMUSCULAR | Status: AC
Start: 2015-02-23 — End: 2015-02-23
  Administered 2015-02-23: 4 mg via INTRAVENOUS
  Filled 2015-02-23: qty 1

## 2015-02-23 NOTE — ED Notes (Signed)
Bed: [facility] Expected date:  Expected time:  Means of arrival:  Comments: 

## 2015-02-23 NOTE — ED Notes (Signed)
Per EMS pt coming from home with c/o right flank pain, s/p lithotrypsy two days ago. Pt denies dysuria, hematuria, reports nausea, was given 4 mg zofran en route.

## 2015-02-23 NOTE — ED Provider Notes (Signed)
CSN: [id number]     Arrival date & time 02/23/15  1846 History   First MD Initiated Contact with Patient 02/23/15 1922     Chief Complaint  Patient presents with  . Flank Pain   Ronald [name] is a 44 y.o. male with a history of kidney stones with lithotripsy and stent placement who presents to the ED complaining of right flank pain ongoing for the past 4 days worse today. The patient reports he had right-sided lithotripsy performed this Monday, or 4 days ago, by Dr. Gerome [facility]uschinsky [facility]. He reports he's had some pain since the lithotripsy but it worsened today. He reports he feels that he has a kidney stone. He reports he is having right-sided flank pain that he rates it a 5 out of 10 and is colicky and can increase to an 8 out of 10. He reports this pain can radiate into his right lower abdomen into his right testicle. He reports this feels exactly like his kidney stones. He denies any urinary symptoms or hematuria. He reports he last took ibuprofen at 12 PM for treatment today with mild relief. He has not taken any of his narcotic pain medicines for treatment today. He reports some associated nausea and he vomited once earlier today. Reports his last bowel movement was this morning. The patient denies fevers, chills, dysuria, hematuria, urinary pregnancy, urinary urgency, hematemesis, hematochezia, chest pain, shortness of breath, rashes, penile pain, penile discharge, or scrotal swelling.   (Consider location/radiation/quality/duration/timing/severity/associated sxs/prior Treatment) HPI  Past Medical History  Diagnosis Date  . Asthma    Past Surgical History  Procedure Laterality Date  . Cardiac valve surgery    . Tonsillectomy    . Lithotripsy     No family history on file. History  Substance Use Topics  . Smoking status: Never Smoker   . Smokeless tobacco: Never Used  . Alcohol Use: No    Review of Systems  Constitutional: Negative for fever and chills.  HENT: Negative for  congestion and sore throat.   Eyes: Negative for visual disturbance.  Respiratory: Negative for cough and shortness of breath.   Cardiovascular: Negative for chest pain and palpitations.  Gastrointestinal: Positive for nausea, vomiting and abdominal pain. Negative for diarrhea, blood in stool and abdominal distention.  Genitourinary: Positive for flank pain and testicular pain. Negative for dysuria, urgency, frequency, hematuria, decreased urine volume, discharge, penile swelling, scrotal swelling, difficulty urinating, genital sores and penile pain.  Musculoskeletal: Negative for back pain and neck pain.  Skin: Negative for rash.  Neurological: Negative for weakness, numbness and headaches.      Allergies  Review of patient's allergies indicates no known allergies.  Home Medications   Prior to Admission medications   Medication Sig Start Date End Date Taking? Authorizing Provider  HYDROcodone-acetaminophen (NORCO/VICODIN) 5-325 MG per tablet Take 1 tablet by mouth every 6 hours if needed for pain 02/20/15  Yes Historical Provider, MD  ondansetron (ZOFRAN ODT) 8 MG disintegrating tablet Take 1 tablet (8 mg total) by mouth every 8 (eight) hours as needed for nausea or vomiting. Patient not taking: Reported on 02/23/2015 08/16/14   Marisa Severinlga Otter, MD  oxyCODONE-acetaminophen (PERCOCET/ROXICET) 5-325 MG per tablet Take 2 tablets by mouth every 4 (four) hours as needed for severe pain. Patient not taking: Reported on 02/23/2015 08/16/14   Marisa Severinlga Otter, MD   BP 130/79 mmHg  Pulse 87  Temp(Src) 99.1 F (37.3 C) (Oral)  Resp 18  SpO2 100% Physical Exam  Constitutional:  He appears well-developed and well-nourished. No distress.  Nontoxic appearing. Obese male.  HENT:  Head: Normocephalic and atraumatic.  Mouth/Throat: Oropharynx is clear and moist. No oropharyngeal exudate.  Eyes: Conjunctivae are normal. Pupils are equal, round, and reactive to light. Right eye exhibits no discharge. Left eye  exhibits no discharge.  Neck: Neck supple.  Cardiovascular: Normal rate, regular rhythm, normal heart sounds and intact distal pulses.  Exam reveals no gallop and no friction rub.   No murmur heard. Pulmonary/Chest: Effort normal and breath sounds normal. No respiratory distress. He has no wheezes. He has no rales.  Abdominal: Soft. Bowel sounds are normal. He exhibits no distension and no mass. There is tenderness. There is no rebound and no guarding.  Abdomen is soft. Bowel sounds are present. Patient has mild right-sided flank tenderness. Patient also has mild epigastric and right upper and lower abdominal tenderness to palpation. No rebound tenderness. Negative Rovsing sign. Negative psoas and obturator sign.  Genitourinary: Prostate normal and penis normal. No penile tenderness.  Scrotal contents normal. No scrotal swelling or penile swelling or tenderness.   Musculoskeletal: He exhibits no edema.  Lymphadenopathy:    He has no cervical adenopathy.  Neurological: He is alert. Coordination normal.  Skin: Skin is warm and dry. No rash noted. He is not diaphoretic. No erythema. No pallor.  Psychiatric: He has a normal mood and affect. His behavior is normal.  Nursing note and vitals reviewed.   ED Course  Procedures (including critical care time) Labs Review Labs Reviewed  URINALYSIS, ROUTINE W REFLEX MICROSCOPIC (NOT AT [facility]) - Abnormal; Notable for the following:    APPearance CLOUDY (*)    Hgb urine dipstick LARGE (*)    Protein, ur 30 (*)    All other components within normal limits  COMPREHENSIVE METABOLIC PANEL - Abnormal; Notable for the following:    Glucose, Bld 126 (*)    Creatinine, Ser 1.53 (*)    GFR calc non Af Amer 55 (*)    All other components within normal limits  LIPASE, BLOOD - Abnormal; Notable for the following:    Lipase 10 (*)    All other components within normal limits  CBC WITH DIFFERENTIAL/PLATELET - Abnormal; Notable for the following:    WBC 12.4  (*)    Neutrophils Relative % 83 (*)    Neutro Abs 10.3 (*)    Lymphocytes Relative 11 (*)    All other components within normal limits  URINE MICROSCOPIC-ADD ON    Imaging Review US Renal  02/23/2015   CLINICAL DATA:  44 year old male with right flank pain and history of kidney stones and lithotripsy.  EXAM: RENAL / URINARY TRACT ULTRASOUND COMPLETE  COMPARISON:  Abdominal radiograph dated 02/14/2015 and CT dated 08/16/2014  FINDINGS: Evaluation of this exam is limited due to patient's body habitus.  Right Kidney:  Length: 13 cm. There is mild right hydronephrosis. An 8 mm nonobstructing echogenic stone is noted in the right renal inter pole. The right renal pelvis is mildly dilated and measures 3.4 cm in diameter.  Left Kidney:  Length: 14 cm. Mild left hydronephrosis with dilatation of the intrarenal collecting system. There is dilatation of the left renal pelvis measuring up to 2 cm in diameter. A 1.2 cm nonobstructing left renal inferior pole calculus identified.  Bladder:  Appears normal for degree of bladder distention.  Partially visualized fatty infiltration of the liver.  IMPRESSION: Mild bilateral hydronephrosis and nonobstructing bilateral renal calculi.   Electronically Signed  By: Elgie Collard M.D.   On: 02/23/2015 21:49     EKG Interpretation None      Filed Vitals:   02/23/15 1857 02/23/15 2135 02/23/15 2340 02/24/15 0037  BP: 146/76 125/61 114/49 130/79  Pulse: 81 87 90 87  Temp: 99.1 F (37.3 C)     TempSrc: Oral     Resp: 16 18 18 18   SpO2: 98% 100% 98% 100%     MDM   Meds given in ED:  Medications  ondansetron (ZOFRAN) injection 4 mg (4 mg Intravenous Given 02/23/15 2003)  morphine 4 MG/ML injection 4 mg (4 mg Intravenous Given 02/23/15 2003)  morphine 4 MG/ML injection 4 mg (4 mg Intravenous Given 02/23/15 2234)  sodium chloride 0.9 % bolus 1,000 mL (0 mLs Intravenous Stopped 02/24/15 0051)  oxyCODONE-acetaminophen (PERCOCET/ROXICET) 5-325 MG per tablet 1  tablet (1 tablet Oral Given 02/24/15 0046)    Discharge Medication List as of 02/24/2015 12:26 AM      Final diagnoses:  Kidney stones  Elevated serum creatinine   This  is a 44 y.o. male with a history of kidney stones with lithotripsy and stent placement who presents to the ED complaining of right flank pain ongoing for the past 4 days worse today. The patient reports he had right-sided lithotripsy performed this Monday, or 4 days ago, by Dr. Gerome Sam in [facility]. He reports he's had some pain since the lithotripsy but it worsened today. He reports he feels that he has a kidney stone. He reports he is having right-sided flank pain that he rates it a 5 out of 10 and is colicky and can increase to an 8 out of 10.  On exam the patient is afebrile and nontoxic-appearing. His abdomen is soft and nontender to palpation. He does have mild right flank tenderness to palpation. Patient's urinalysis is nitrite and leukocyte negative. He has had large hemoglobin. CMP indicates an elevated creatinine at 1.53. CP is otherwise unremarkable. CBC is unremarkable. Renal ultrasound showed mild bilateral hydronephrosis and nonobstructing bilateral renal calculi. Reevaluation the patient reports his pain is down to 3 out of 10. Patient given a second round of morphine which completely resolved his pain. I consulted with urologist Dr. Edwin Cap who would like the patient to follow up with Dr. Gerome Sam in his office. I advised the patient to follow up in his office tomorrow. At second reevaluation he reports he has 1/10 pain and feels ready for discharge. Will give percocet prior to discharge. The patient reports he has plenty of Percocet at home. I advised him to stop taking ibuprofen until he has his creatinine rechecked. I advised him to have his creatinine rechecked by his PCP or by his urologist. Advised him to follow-up with his urologist tomorrow. Strict return precautions provided. I advised the patient to follow-up  with their primary care provider this week. I advised the patient to return to the emergency department with new or worsening symptoms or new concerns. The patient verbalized understanding and agreement with plan.    This patient was discussed with Dr. Loretha Stapler who agrees with assessment and plan.    Everlene Farrier, PA-C 02/24/15 9528  Blake Divine, MD 02/24/15 [phone]

## 2015-02-24 DIAGNOSIS — N200 Calculus of kidney: Secondary | ICD-10-CM | POA: Diagnosis not present

## 2015-02-24 MED ORDER — OXYCODONE-ACETAMINOPHEN 5-325 MG PO TABS
1.0000 | ORAL_TABLET | Freq: Once | ORAL | Status: AC
  Administered 2015-02-24: 1 via ORAL
  Filled 2015-02-24: qty 1

## 2015-02-24 NOTE — Discharge Instructions (Signed)
Please increase your fluid intake to 6-8 glasses of water per day. Please follow-up with your urologist and her primary care provider to have your creatinine rechecked and further evaluation of your kidney stones.  Kidney Stones Kidney stones (urolithiasis) are deposits that form inside your kidneys. The intense pain is caused by the stone moving through the urinary tract. When the stone moves, the ureter goes into spasm around the stone. The stone is usually passed in the urine.  CAUSES   A disorder that makes certain neck glands produce too much parathyroid hormone (primary hyperparathyroidism).  A buildup of uric acid crystals, similar to gout in your joints.  Narrowing (stricture) of the ureter.  A kidney obstruction present at birth (congenital obstruction).  Previous surgery on the kidney or ureters.  Numerous kidney infections. SYMPTOMS   Feeling sick to your stomach (nauseous).  Throwing up (vomiting).  Blood in the urine (hematuria).  Pain that usually spreads (radiates) to the groin.  Frequency or urgency of urination. DIAGNOSIS   Taking a history and physical exam.  Blood or urine tests.  CT scan.  Occasionally, an examination of the inside of the urinary bladder (cystoscopy) is performed. TREATMENT   Observation.  Increasing your fluid intake.  Extracorporeal shock wave lithotripsy--This is a noninvasive procedure that uses shock waves to break up kidney stones.  Surgery may be needed if you have severe pain or persistent obstruction. There are various surgical procedures. Most of the procedures are performed with the use of small instruments. Only small incisions are needed to accommodate these instruments, so recovery time is minimized. The size, location, and chemical composition are all important variables that will determine the proper choice of action for you. Talk to your health care provider to better understand your situation so that you will  minimize the risk of injury to yourself and your kidney.  HOME CARE INSTRUCTIONS   Drink enough water and fluids to keep your urine clear or pale yellow. This will help you to pass the stone or stone fragments.  Strain all urine through the provided strainer. Keep all particulate matter and stones for your health care provider to see. The stone causing the pain may be as small as a grain of salt. It is very important to use the strainer each and every time you pass your urine. The collection of your stone will allow your health care provider to analyze it and verify that a stone has actually passed. The stone analysis will often identify what you can do to reduce the incidence of recurrences.  Only take over-the-counter or prescription medicines for pain, discomfort, or fever as directed by your health care provider.  Make a follow-up appointment with your health care provider as directed.  Get follow-up X-rays if required. The absence of pain does not always mean that the stone has passed. It may have only stopped moving. If the urine remains completely obstructed, it can cause loss of kidney function or even complete destruction of the kidney. It is your responsibility to make sure X-rays and follow-ups are completed. Ultrasounds of the kidney can show blockages and the status of the kidney. Ultrasounds are not associated with any radiation and can be performed easily in a matter of minutes. SEEK MEDICAL CARE IF:  You experience pain that is progressive and unresponsive to any pain medicine you have been prescribed. SEEK IMMEDIATE MEDICAL CARE IF:   Pain cannot be controlled with the prescribed medicine.  You have a fever or shaking  chills.  The severity or intensity of pain increases over 18 hours and is not relieved by pain medicine.  You develop a new onset of abdominal pain.  You feel faint or pass out.  You are unable to urinate. MAKE SURE YOU:   Understand these  instructions.  Will watch your condition.  Will get help right away if you are not doing well or get worse. Document Released: 08/12/2005 Document Revised: 04/14/2013 Document Reviewed: 01/13/2013 [facility] Patient Information 2015 [place], [place]. This information is not intended to replace advice given to you by your health care provider. Make sure you discuss any questions you have with your health care provider.

## 2015-07-18 ENCOUNTER — Encounter (HOSPITAL_COMMUNITY): Payer: Self-pay | Admitting: *Deleted

## 2015-07-18 ENCOUNTER — Emergency Department (HOSPITAL_COMMUNITY)
Admission: EM | Admit: 2015-07-18 | Discharge: 2015-07-19 | Disposition: A | Discharge status: Home / Self Care | Payer: Medicare (Managed Care) | Attending: Emergency Medicine | Admitting: Emergency Medicine

## 2015-07-18 DIAGNOSIS — R1912 Hyperactive bowel sounds: Secondary | ICD-10-CM | POA: Insufficient documentation

## 2015-07-18 DIAGNOSIS — J45909 Unspecified asthma, uncomplicated: Secondary | ICD-10-CM | POA: Diagnosis not present

## 2015-07-18 DIAGNOSIS — N200 Calculus of kidney: Secondary | ICD-10-CM | POA: Insufficient documentation

## 2015-07-18 DIAGNOSIS — R52 Pain, unspecified: Secondary | ICD-10-CM

## 2015-07-18 DIAGNOSIS — R1032 Left lower quadrant pain: Secondary | ICD-10-CM | POA: Diagnosis present

## 2015-07-18 LAB — URINE MICROSCOPIC-ADD ON

## 2015-07-18 LAB — URINALYSIS, ROUTINE W REFLEX MICROSCOPIC
Bilirubin Urine: NEGATIVE
GLUCOSE, UA: NEGATIVE mg/dL
Ketones, ur: NEGATIVE mg/dL
Nitrite: NEGATIVE
PROTEIN: 100 mg/dL — AB
Specific Gravity, Urine: 1.027 (ref 1.005–1.030)
pH: 5 (ref 5.0–8.0)

## 2015-07-18 LAB — I-STAT CHEM 8, ED
BUN: 17 mg/dL (ref 6–20)
CREATININE: 1.5 mg/dL — AB (ref 0.61–1.24)
Calcium, Ion: 1.2 mmol/L (ref 1.12–1.23)
Chloride: 103 mmol/L (ref 101–111)
Glucose, Bld: 147 mg/dL — ABNORMAL HIGH (ref 65–99)
HEMATOCRIT: 47 % (ref 39.0–52.0)
HEMOGLOBIN: 16 g/dL (ref 13.0–17.0)
POTASSIUM: 4.7 mmol/L (ref 3.5–5.1)
Sodium: 140 mmol/L (ref 135–145)
TCO2: 26 mmol/L (ref 0–100)

## 2015-07-18 MED ORDER — KETOROLAC TROMETHAMINE 30 MG/ML IJ SOLN
30.0000 mg | Freq: Once | INTRAMUSCULAR | Status: AC
  Administered 2015-07-18: 30 mg via INTRAVENOUS
  Filled 2015-07-18: qty 1

## 2015-07-18 MED ORDER — ONDANSETRON HCL 4 MG/2ML IJ SOLN
4.0000 mg | Freq: Once | INTRAMUSCULAR | Status: AC
  Administered 2015-07-18: 4 mg via INTRAVENOUS
  Filled 2015-07-18: qty 2

## 2015-07-18 MED ORDER — FENTANYL CITRATE (PF) 100 MCG/2ML IJ SOLN
50.0000 ug | Freq: Once | INTRAMUSCULAR | Status: AC
  Administered 2015-07-18: 50 ug via INTRAVENOUS
  Filled 2015-07-18: qty 2

## 2015-07-18 NOTE — ED Provider Notes (Signed)
CSN: [id number]     Arrival date & time 07/18/15  2117 History   By signing my name below, I, Ronald [name], attest that this documentation has been prepared under the direction and in the presence of Neeya Prigmore, MD.  Electronically Signed: Arlan [name], ED Scribe. 07/18/2015. 12:34 AM.   Chief Complaint  Patient presents with  . Flank Pain   Patient is a 44 y.o. male presenting with flank pain. The history is provided by the patient. No language interpreter was used.  Flank Pain This is a new problem. The current episode started 3 to 5 hours ago. The problem occurs constantly. The problem has not changed since onset.Associated symptoms include abdominal pain. Pertinent negatives include no chest pain, no headaches and no shortness of breath. Nothing aggravates the symptoms. Nothing relieves the symptoms. He has tried nothing for the symptoms. The treatment provided no relief.    HPI Comments: Ronald [name] is a 44 y.o. male with a PMHx of kidney stones who presents to the Emergency Department complaining of constant, ongoing L sided flank pain and mild lower abdominal pain onset 3:00 PM this afternoon. Pain is described as sharp. Ongoing, nausea, and trouble starting a stream also reported. No aggravating or alleviating factors at this time. Prescribed Hydrocodone attempted prior to arrival. Ronald [name] recent underwent a lipotripsy yesterday but denies any issues with procedure. Pt was advised he would pass small fragments post procedure. No recent fever or chills. No known allergies to medications.  PCP: No primary care provider on file.    Past Medical History  Diagnosis Date  . Asthma    Past Surgical History  Procedure Laterality Date  . Cardiac valve surgery    . Tonsillectomy    . Lithotripsy     No family history on file. Social History  Substance Use Topics  . Smoking status: Never Smoker   . Smokeless tobacco: Never Used  . Alcohol Use: No    Review of Systems   Constitutional: Negative for fever and chills.  Respiratory: Negative for cough and shortness of breath.   Cardiovascular: Negative for chest pain.  Gastrointestinal: Positive for nausea, vomiting and abdominal pain.  Genitourinary: Positive for flank pain. Negative for dysuria and difficulty urinating.  Musculoskeletal: Negative for neck pain.  Skin: Negative for rash.  Neurological: Negative for headaches.  Psychiatric/Behavioral: Negative for confusion.  All other systems reviewed and are negative.     Allergies  Review of patient's allergies indicates no known allergies.  Home Medications   Prior to Admission medications   Medication Sig Start Date End Date Taking? Authorizing Provider  HYDROcodone-acetaminophen (NORCO/VICODIN) 5-325 MG per tablet Take 1 tablet by mouth every 4 hours if needed for pain. 02/20/15  Yes Historical Provider, MD  ondansetron (ZOFRAN ODT) 8 MG disintegrating tablet Take 1 tablet (8 mg total) by mouth every 8 (eight) hours as needed for nausea or vomiting. Patient not taking: Reported on 02/23/2015 08/16/14   Marisa Severin, MD  oxyCODONE-acetaminophen (PERCOCET/ROXICET) 5-325 MG per tablet Take 2 tablets by mouth every 4 (four) hours as needed for severe pain. Patient not taking: Reported on 02/23/2015 08/16/14   Marisa Severin, MD   Triage Vitals: BP 143/91 mmHg  Pulse 83  Temp(Src) 98.7 F (37.1 C) (Oral)  Resp 16  Ht  (1.702 m)  Wt 286 lb (129.729 kg)  BMI 44.78 kg/m2  SpO2 96%   Physical Exam  Constitutional: He is oriented to person, place, and time. He appears  well-developed and well-nourished.  HENT:  Head: Normocephalic and atraumatic.  Mouth/Throat: Oropharynx is clear and moist. No oropharyngeal exudate.  Eyes: EOM are normal. Pupils are equal, round, and reactive to light.  Neck: Normal range of motion. Neck supple.  Cardiovascular: Normal rate, regular rhythm, normal heart sounds and intact distal pulses.   Pulmonary/Chest: Effort  normal and breath sounds normal. No respiratory distress. He has no wheezes. He has no rales.  Abdominal: Soft. He exhibits no distension. There is no tenderness. There is no rebound.  Hyperactive bowel sounds throughout  Musculoskeletal: Normal range of motion.  Neurological: He is alert and oriented to person, place, and time. He has normal reflexes.  5/5 strength to lower extremities bilaterally   Skin: Skin is warm and dry.  Psychiatric: He has a normal mood and affect. Judgment normal.  Nursing note and vitals reviewed.   ED Course  Procedures (including critical care time)  DIAGNOSTIC STUDIES: Oxygen Saturation is 96% on RA, adequate by my interpretation.    COORDINATION OF CARE: 11:28 PM- Will give Toradol, Sublimaze, and Zofran. Will order CT renal stone study, CBC, i-stat chem 8, and urinalysis. Discussed treatment plan with pt at bedside and pt agreed to plan.     Labs Review Labs Reviewed  URINALYSIS, ROUTINE W REFLEX MICROSCOPIC (NOT AT [facility]) - Abnormal; Notable for the following:    APPearance CLOUDY (*)    Hgb urine dipstick LARGE (*)    Protein, ur 100 (*)    Leukocytes, UA SMALL (*)    All other components within normal limits  URINE MICROSCOPIC-ADD ON - Abnormal; Notable for the following:    Squamous Epithelial / LPF 0-5 (*)    Bacteria, UA MANY (*)    Casts GRANULAR CAST (*)    All other components within normal limits    Imaging Review No results found. I have personally reviewed and evaluated these images and lab results as part of my medical decision-making.   EKG Interpretation None      MDM   Final diagnoses:  None    Medications  tamsulosin (FLOMAX) capsule 0.4 mg (0.4 mg Oral Given 07/19/15 0318)  fentaNYL (SUBLIMAZE) injection 50 mcg (50 mcg Intravenous Given 07/18/15 2209)  ondansetron (ZOFRAN) injection 4 mg (4 mg Intravenous Given 07/18/15 2210)  ketorolac (TORADOL) 30 MG/ML injection 30 mg (30 mg Intravenous Given 07/18/15 2342)   morphine 4 MG/ML injection 4 mg (4 mg Intravenous Given 07/19/15 0209)   Results for orders placed or performed during the hospital encounter of 07/18/15  Urinalysis, Routine w reflex microscopic (not at [facility])  Result Value Ref Range   Color, Urine YELLOW YELLOW   APPearance CLOUDY (A) CLEAR   Specific Gravity, Urine 1.027 1.005 - 1.030   pH 5.0 5.0 - 8.0   Glucose, UA NEGATIVE NEGATIVE mg/dL   Hgb urine dipstick LARGE (A) NEGATIVE   Bilirubin Urine NEGATIVE NEGATIVE   Ketones, ur NEGATIVE NEGATIVE mg/dL   Protein, ur 540100 (A) NEGATIVE mg/dL   Nitrite NEGATIVE NEGATIVE   Leukocytes, UA SMALL (A) NEGATIVE  Urine microscopic-add on  Result Value Ref Range   Squamous Epithelial / LPF 0-5 (A) NONE SEEN   WBC, UA 6-30 0 - 5 WBC/hpf   RBC / HPF TOO NUMEROUS TO COUNT 0 - 5 RBC/hpf   Bacteria, UA MANY (A) NONE SEEN   Casts GRANULAR CAST (A) NEGATIVE  CBC with Differential/Platelet  Result Value Ref Range   WBC 15.7 (H) 4.0 - 10.5 K/uL  RBC 5.31 4.22 - 5.81 MIL/uL   Hemoglobin 14.5 13.0 - 17.0 g/dL   HCT 16.1 09.6 - 04.5 %   MCV 83.1 78.0 - 100.0 fL   MCH 27.3 26.0 - 34.0 pg   MCHC 32.9 30.0 - 36.0 g/dL   RDW 40.9 81.1 - 91.4 %   Platelets 190 150 - 400 K/uL   Neutrophils Relative % 91 %   Neutro Abs 14.3 (H) 1.7 - 7.7 K/uL   Lymphocytes Relative 5 %   Lymphs Abs 0.8 0.7 - 4.0 K/uL   Monocytes Relative 4 %   Monocytes Absolute 0.6 0.1 - 1.0 K/uL   Eosinophils Relative 0 %   Eosinophils Absolute 0.0 0.0 - 0.7 K/uL   Basophils Relative 0 %   Basophils Absolute 0.0 0.0 - 0.1 K/uL  I-stat chem 8, ed  Result Value Ref Range   Sodium 140 135 - 145 mmol/L   Potassium 4.7 3.5 - 5.1 mmol/L   Chloride 103 101 - 111 mmol/L   BUN 17 6 - 20 mg/dL   Creatinine, Ser 7.82 (H) 0.61 - 1.24 mg/dL   Glucose, Bld 956 (H) 65 - 99 mg/dL   Calcium, Ion 2.13 1.12 - 1.23 mmol/L   TCO2 26 0 - 100 mmol/L   Hemoglobin 16.0 13.0 - 17.0 g/dL   HCT 08.6 57.8 - 46.9 %   Ct Renal Stone  Study  07/19/2015  CLINICAL DATA:  Left flank pain and mid abdominal pain starting at 1500 hours. Lithotripsy on 07/17/2015. Difficulty urinating. Red and white cells in the urine. EXAM: CT ABDOMEN AND PELVIS WITHOUT CONTRAST TECHNIQUE: Multidetector CT imaging of the abdomen and pelvis was performed following the standard protocol without IV contrast. COMPARISON:  08/16/2014 FINDINGS: Dependent atelectasis in the lung bases. Multiple calcifications and stone fragments demonstrated in the left kidney. Largest stone is in the lower pole and measures 11 mm in diameter. There is persistent left hydronephrosis and hydroureter. Multiple large stones demonstrated in the distal left ureter with diameter of 7 mm. Small stone fragments also demonstrated in the bladder and probably in the urethra. Infiltration in the fat around the left kidney. Calcifications demonstrated in the right kidney, largest in the renal pelvis measures 10 mm diameter. No hydronephrosis or hydroureter on the right. No bladder wall thickening. The unenhanced appearance of the liver, spleen, gallbladder, pancreas, adrenal glands, abdominal aorta, inferior vena cava, and retroperitoneal lymph nodes is unremarkable. Stomach and small bowel are mostly decompressed. Stool-filled colon without abnormal distention. No free air or free fluid in the abdomen. Pelvis: Appendix is normal in caliber. Calcifications present within the appendix consistent with a appendicoliths. No inflammatory stranding. Prostate gland is not enlarged. No pelvic mass or lymphadenopathy. No free or loculated pelvic fluid collections. Degenerative changes in the spine. No destructive bone lesions. IMPRESSION: Multiple stones and stone fragments demonstrated throughout the left kidney and left ureter with persistent left renal hydronephrosis and hydroureter. Up to 7 mm stone fragments demonstrated in the distal left ureter. Stone fragments in the bladder and probably urethra.  Nonobstructing stones in the right kidney. Electronically Signed   By: Burman Nieves M.D.   On: 07/19/2015 02:48      I personally performed the services described in this documentation, which was scribed in my presence. The recorded information has been reviewed and is accurate.     Pt has been diagnosed with a Kidney Stone via CT. There is no evidence of significant hydronephrosis, serum creatine WNL, vitals sign  stable and the pt does not have irratractable vomiting. Pt will be dc home with medications & has been advised to follow up with Urologist this week.  Cy Blamer, MD 07/19/15 [phone]

## 2015-07-18 NOTE — ED Notes (Signed)
Bilateral flank and lower abdominal pain with n/v since today.  Pt was recently dx with multiple kidney stones and describes the pain as sharp.  Pt also has difficulty urinating.

## 2015-07-19 ENCOUNTER — Emergency Department (HOSPITAL_COMMUNITY): Payer: Medicare (Managed Care)

## 2015-07-19 ENCOUNTER — Encounter (HOSPITAL_COMMUNITY): Payer: Self-pay | Admitting: Emergency Medicine

## 2015-07-19 LAB — CBC WITH DIFFERENTIAL/PLATELET
BASOS ABS: 0 10*3/uL (ref 0.0–0.1)
BASOS PCT: 0 %
EOS ABS: 0 10*3/uL (ref 0.0–0.7)
Eosinophils Relative: 0 %
HEMATOCRIT: 44.1 % (ref 39.0–52.0)
HEMOGLOBIN: 14.5 g/dL (ref 13.0–17.0)
Lymphocytes Relative: 5 %
Lymphs Abs: 0.8 10*3/uL (ref 0.7–4.0)
MCH: 27.3 pg (ref 26.0–34.0)
MCHC: 32.9 g/dL (ref 30.0–36.0)
MCV: 83.1 fL (ref 78.0–100.0)
MONOS PCT: 4 %
Monocytes Absolute: 0.6 10*3/uL (ref 0.1–1.0)
NEUTROS ABS: 14.3 10*3/uL — AB (ref 1.7–7.7)
NEUTROS PCT: 91 %
Platelets: 190 10*3/uL (ref 150–400)
RBC: 5.31 MIL/uL (ref 4.22–5.81)
RDW: 13 % (ref 11.5–15.5)
WBC: 15.7 10*3/uL — ABNORMAL HIGH (ref 4.0–10.5)

## 2015-07-19 MED ORDER — TAMSULOSIN HCL 0.4 MG PO CAPS
0.4000 mg | ORAL_CAPSULE | Freq: Every day | ORAL | Status: DC
  Administered 2015-07-19: 0.4 mg via ORAL
  Filled 2015-07-19: qty 1

## 2015-07-19 MED ORDER — ONDANSETRON 8 MG PO TBDP: ORAL_TABLET | ORAL | Status: DC

## 2015-07-19 MED ORDER — MORPHINE SULFATE (PF) 4 MG/ML IV SOLN
4.0000 mg | Freq: Once | INTRAVENOUS | Status: AC
  Administered 2015-07-19: 4 mg via INTRAVENOUS
  Filled 2015-07-19: qty 1

## 2015-07-19 MED ORDER — TAMSULOSIN HCL 0.4 MG PO CAPS: 0.4000 mg | ORAL_CAPSULE | Freq: Every day | ORAL | Status: DC

## 2015-07-19 MED ORDER — IBUPROFEN 800 MG PO TABS: 800.0000 mg | ORAL_TABLET | Freq: Three times a day (TID) | ORAL | Status: DC

## 2016-08-21 IMAGING — US US RENAL
1 series · 14 of 25 positions shown · non-contrast
Comparison: Abdominal radiograph dated 02/14/2015 and CT dated
08/16/2014

CLINICAL DATA: 43-year-old male with right flank pain and history
of kidney stones and lithotripsy.

EXAM:
RENAL / URINARY TRACT ULTRASOUND COMPLETE

[Series 1: us renal · 0.28mm/px · 14 of 65 slices shown]
[im 1/65]
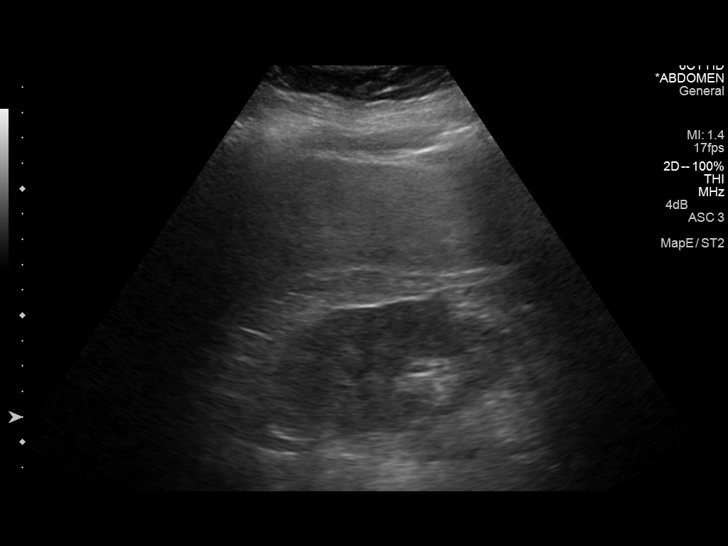
[im 6/65]
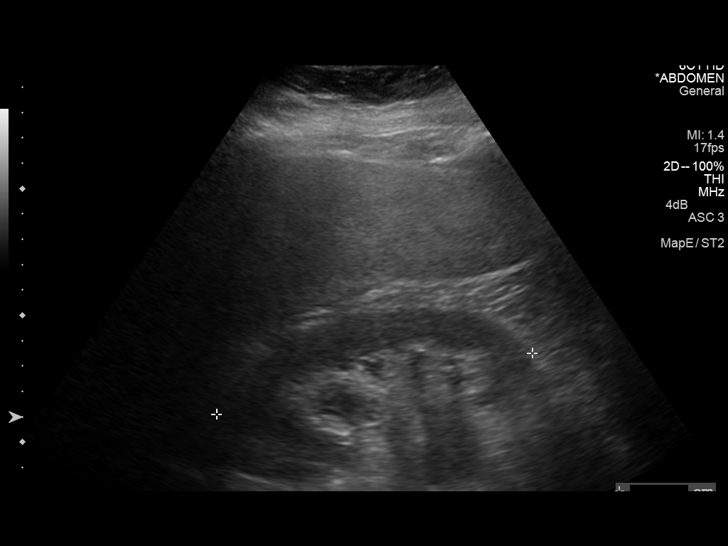
[im 11/65]
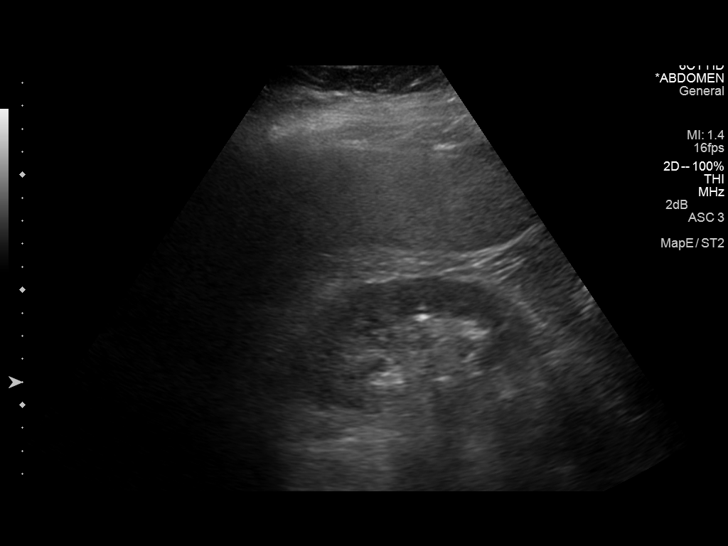
[im 17/65]
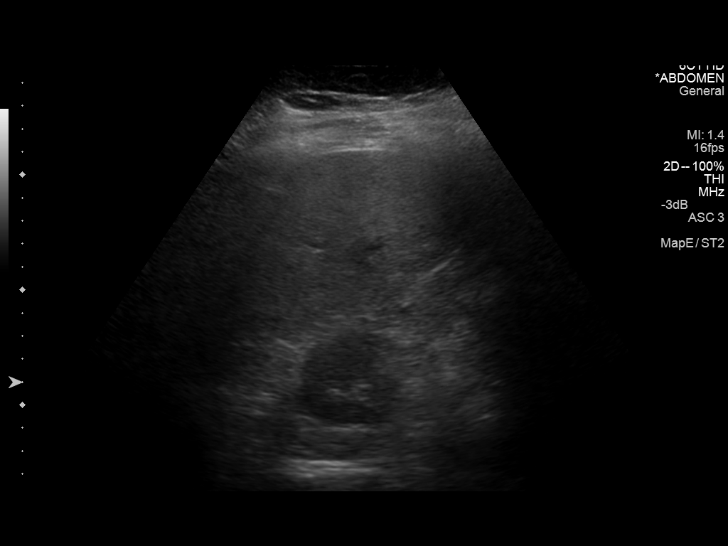
[im 22/65]
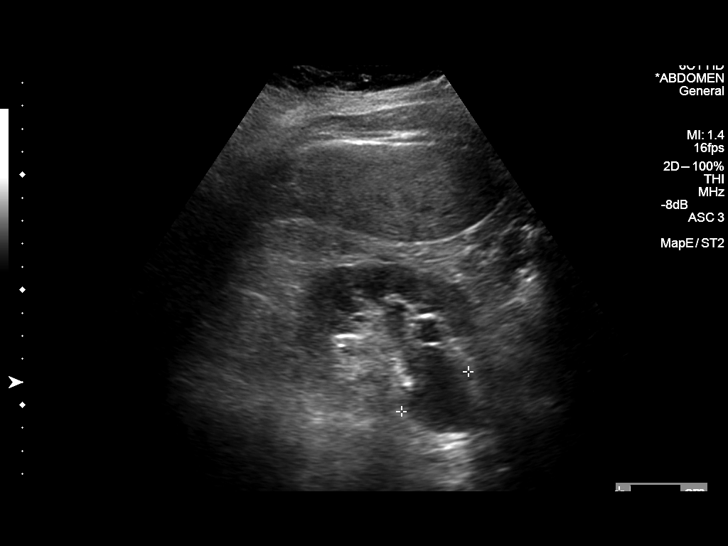
[im 25/65]
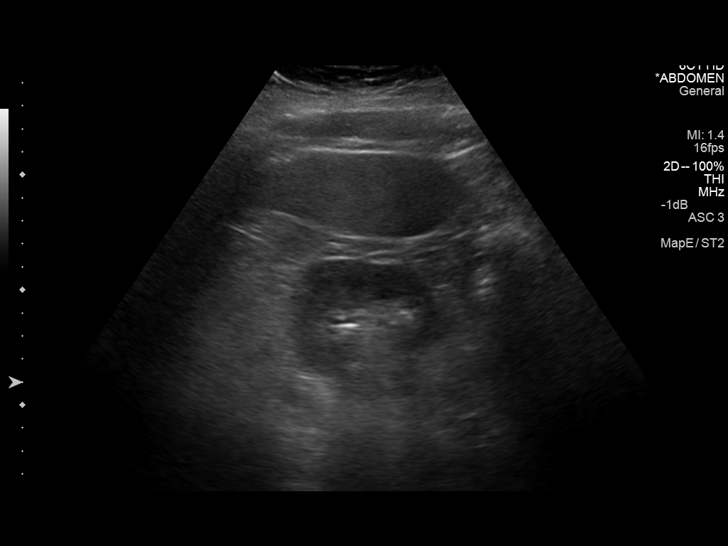
[im 30/65]
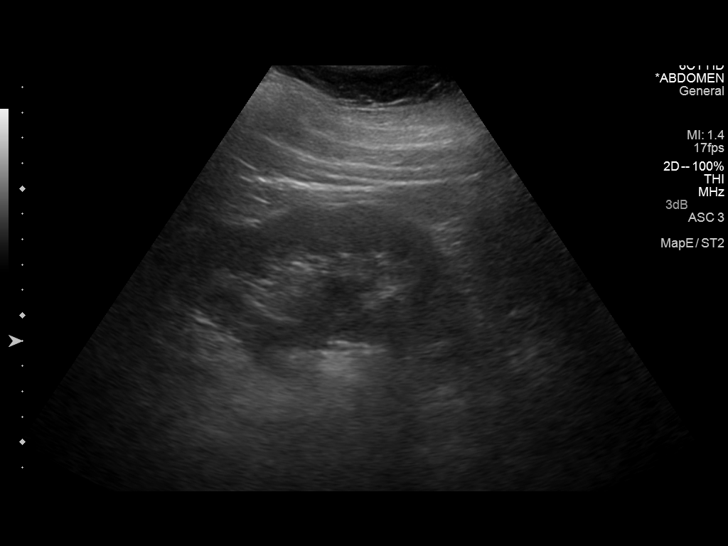
[im 35/65]
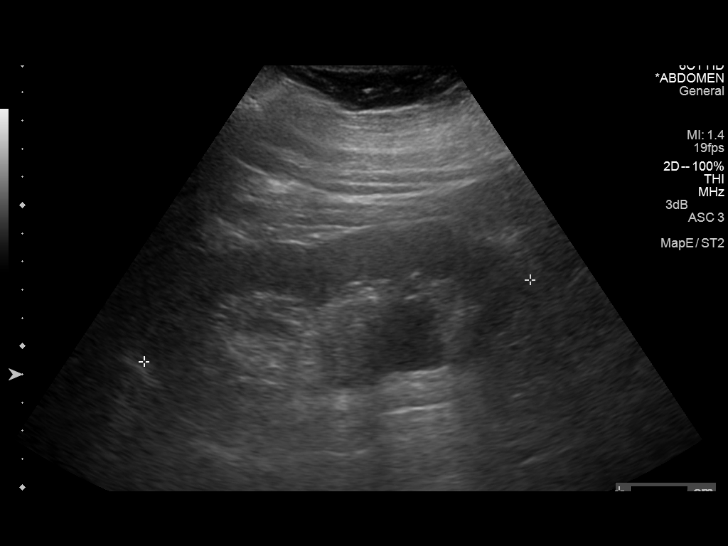
[im 41/65]
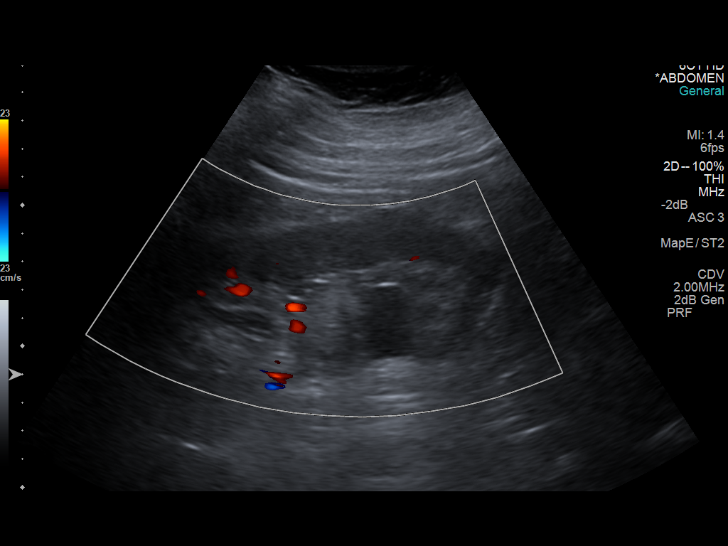
[im 43/65]
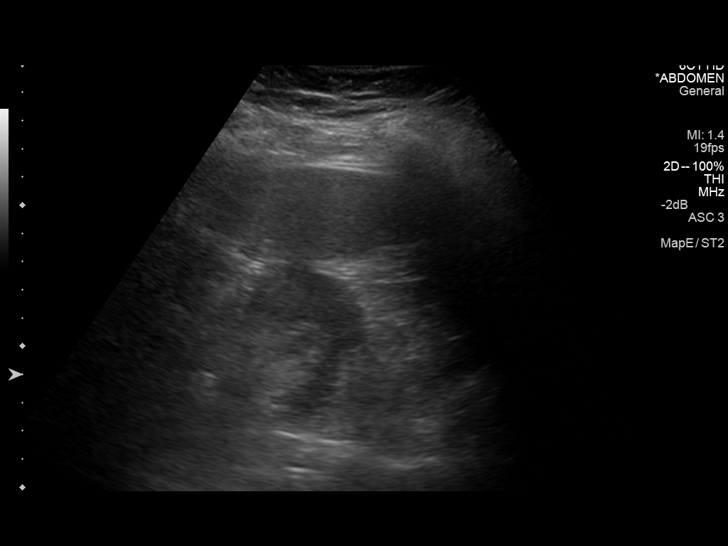
[im 49/65]
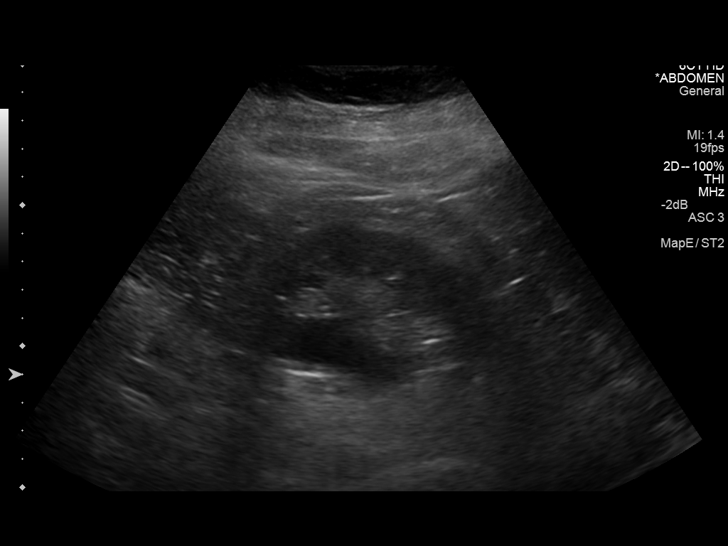
[im 54/65]
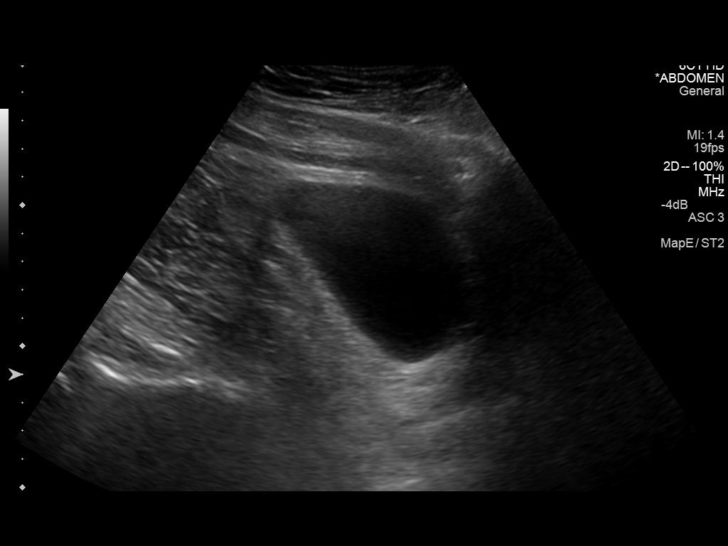
[im 59/65]
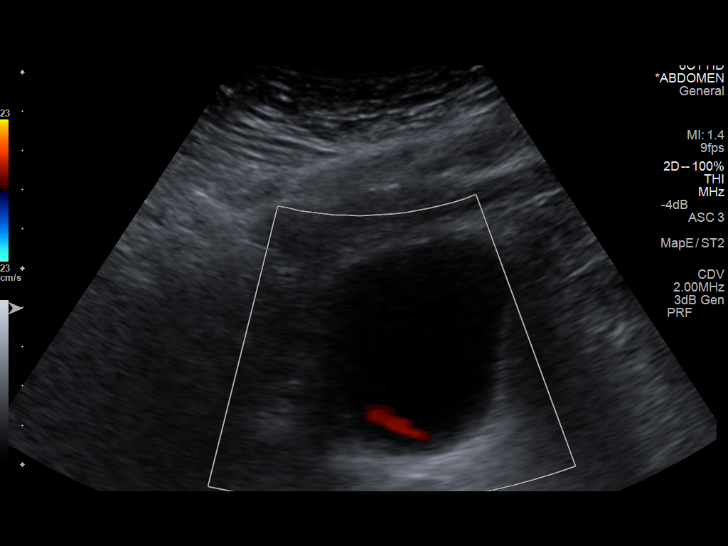
[im 65/65]
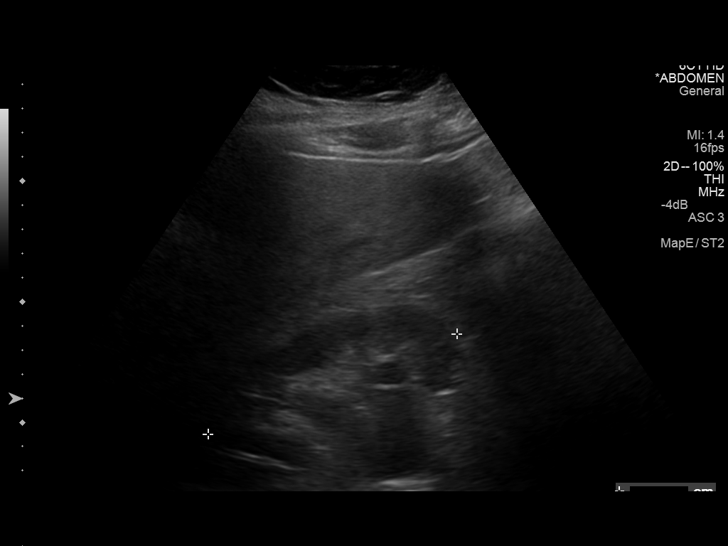

[14 of 25 positions shown; findings below may reference images not displayed]

FINDINGS: Evaluation of this exam is limited due to patient's body habitus.

Right Kidney:

Length: 13 cm. There is mild right hydronephrosis. An 8 mm
nonobstructing echogenic stone is noted in the right renal inter
pole. The right renal pelvis is mildly dilated and measures 3.4 cm
in diameter.

Left Kidney:

Length: 14 cm. Mild left hydronephrosis with dilatation of the
intrarenal collecting system. There is dilatation of the left renal
pelvis measuring up to 2 cm in diameter. A 1.2 cm nonobstructing
left renal inferior pole calculus identified.

Bladder:

Appears normal for degree of bladder distention.

Partially visualized fatty infiltration of the liver.
IMPRESSION: Mild bilateral hydronephrosis and nonobstructing bilateral renal
calculi.

## 2018-01-20 ENCOUNTER — Encounter (HOSPITAL_COMMUNITY): Payer: Self-pay

## 2018-01-20 DIAGNOSIS — M109 Gout, unspecified: Secondary | ICD-10-CM | POA: Insufficient documentation

## 2018-01-20 DIAGNOSIS — Z79899 Other long term (current) drug therapy: Secondary | ICD-10-CM | POA: Diagnosis not present

## 2018-01-20 DIAGNOSIS — J45909 Unspecified asthma, uncomplicated: Secondary | ICD-10-CM | POA: Diagnosis not present

## 2018-01-20 DIAGNOSIS — M79674 Pain in right toe(s): Secondary | ICD-10-CM | POA: Diagnosis present

## 2018-01-20 NOTE — ED Triage Notes (Signed)
Pt complains of right foot pain, redness and warmth for two days

## 2018-01-21 ENCOUNTER — Emergency Department (HOSPITAL_COMMUNITY): Payer: Medicare Other

## 2018-01-21 ENCOUNTER — Emergency Department (HOSPITAL_COMMUNITY)
Admission: EM | Admit: 2018-01-21 | Discharge: 2018-01-21 | Disposition: A | Discharge status: Home / Self Care | Payer: Medicare Other | Attending: Emergency Medicine | Admitting: Emergency Medicine

## 2018-01-21 DIAGNOSIS — M109 Gout, unspecified: Secondary | ICD-10-CM

## 2018-01-21 MED ORDER — HYDROCODONE-ACETAMINOPHEN 5-325 MG PO TABS: 1.0000 | ORAL_TABLET | Freq: Four times a day (QID) | ORAL | 0 refills | Status: AC | PRN

## 2018-01-21 MED ORDER — PREDNISONE 20 MG PO TABS: 40.0000 mg | ORAL_TABLET | Freq: Every day | ORAL | 0 refills | Status: AC

## 2018-01-21 NOTE — Discharge Instructions (Signed)
Your toe pain is thought to be caused by gout.  Cellulitis is not completely ruled out, however as you have no fever or systemic symptoms, it is thought much less likely.  Please pay attention to your symptoms and return if you run a fever or have worsening pain or redness.

## 2018-01-21 NOTE — ED Notes (Signed)
Patient transported to X-ray 

## 2018-01-21 NOTE — ED Provider Notes (Signed)
[facility]-EMERGENCY DEPT Provider Note   CSN: [id number] Arrival date & time: 01/20/18  1957     History   Chief Complaint Chief Complaint  Patient presents with  . Foot Pain    HPI Ronald [name] is a 47 y.o. male.  Patient presents to the emergency department with chief complaint of right great toe pain.  He states the symptoms started 3 to 4 days ago.  He denies any fevers or chills.  States that he has not injured his foot.  Denies any history of gout.  He has not taken anything for the symptoms.    The history is provided by the patient. No language interpreter was used.    Past Medical History:  Diagnosis Date  . Asthma     Patient Active Problem List   Diagnosis Date Noted  . OBESITY, UNSPECIFIED 05/01/2010  . VALVULAR HEART DISEASE 05/01/2010  . HEART MURMUR, SYSTOLIC 05/01/2010    Past Surgical History:  Procedure Laterality Date  . CARDIAC VALVE SURGERY    . LITHOTRIPSY    . TONSILLECTOMY          Home Medications    Prior to Admission medications   Medication Sig Start Date End Date Taking? Authorizing Provider  HYDROcodone-acetaminophen (NORCO/VICODIN) 5-325 MG per tablet Take 1 tablet by mouth every 4 hours if needed for pain. 02/20/15   [provider]  ibuprofen (ADVIL,MOTRIN) 800 MG tablet Take 1 tablet (800 mg total) by mouth 3 (three) times daily. 07/19/15   Palumbo, April, MD  ondansetron (ZOFRAN ODT) 8 MG disintegrating tablet Take 1 tablet (8 mg total) by mouth every 8 (eight) hours as needed for nausea or vomiting. Patient not taking: Reported on 02/23/2015 08/16/14   Marisa Severin, MD  ondansetron [facility] ODT) 8 MG disintegrating tablet  ODT q8 hours prn nausea 07/19/15   Palumbo, April, MD  oxyCODONE-acetaminophen (PERCOCET/ROXICET) 5-325 MG per tablet Take 2 tablets by mouth every 4 (four) hours as needed for severe pain. Patient not taking: Reported on 02/23/2015 08/16/14   Marisa Severin, MD  tamsulosin  (FLOMAX) 0.4 MG CAPS capsule Take 1 capsule (0.4 mg total) by mouth daily. 07/19/15   Palumbo, April, MD    Family History History reviewed. No pertinent family history.  Social History Social History   Tobacco Use  . Smoking status: Never Smoker  . Smokeless tobacco: Never Used  Substance Use Topics  . Alcohol use: No  . Drug use: No     Allergies   Patient has no known allergies.   Review of Systems Review of Systems  All other systems reviewed and are negative.    Physical Exam Updated Vital Signs BP 132/88 (BP Location: Left Arm)   Pulse 80   Temp 98 F (36.7 C) (Oral)   Resp 16   Ht  (1.702 m)   Wt (!) 147.9 kg (326 lb)   SpO2 97%   BMI 51.06 kg/m   Physical Exam Nursing note and vitals reviewed.  Constitutional: Pt appears well-developed and well-nourished. No distress.  HENT:  Head: Normocephalic and atraumatic.  Eyes: Conjunctivae are normal.  Neck: Normal range of motion.  Cardiovascular: Normal rate, regular rhythm. Intact distal pulses.   Capillary refill < 3 sec.  Pulmonary/Chest: Effort normal and breath sounds normal.  Musculoskeletal:  RLE Pt exhibits TTP about the right great toe at the MTP.   ROM: 5/5  Strength: 5/5  Neurological: Pt  is alert. Coordination normal.  Sensation:  5/5 Skin: Skin is warm and dry. Pt is not diaphoretic.  No evidence of open wound or skin tenting Psychiatric: Pt has a normal mood and affect.     ED Treatments / Results  Labs (all labs ordered are listed, but only abnormal results are displayed) Labs Reviewed - No data to display  EKG None  Radiology No results found.  Procedures Procedures (including critical care time)  Medications Ordered in ED Medications - No data to display   Initial Impression / Assessment and Plan / ED Course  I have reviewed the triage vital signs and the nursing notes.  Pertinent labs & imaging results that were available during my care of the patient were  reviewed by me and considered in my medical decision making (see chart for details).     Patient with physical exam worrisome for gout.  No evidence of cellulitis at this time.  No abscess.  Patient is afebrile.  Vital signs are stable.  He has intact distal pulses.  Plain films are negative.  Final Clinical Impressions(s) / ED Diagnoses   Final diagnoses:  Acute gout involving toe of right foot, unspecified cause    ED Discharge Orders        Ordered    predniSONE (DELTASONE) 20 MG tablet  Daily     01/21/18 0445    HYDROcodone-acetaminophen (NORCO/VICODIN) 5-325 MG tablet  Every 6 hours PRN     01/21/18 0445       Roxy Horseman, PA-C 01/21/18 0446    Molpus, Jonny Ruiz, MD 01/21/18 [phone]

## 2018-01-21 NOTE — ED Notes (Signed)
Pt c/o right great toe pain x 4 days.  Pt presents with redness distal to right great toe.

## 2021-12-04 ENCOUNTER — Ambulatory Visit
Admission: EM | Admit: 2021-12-04 | Discharge: 2021-12-04 | Disposition: A | Discharge status: Home / Self Care | Payer: Medicare (Managed Care) | Attending: Physician Assistant | Admitting: Physician Assistant

## 2021-12-04 DIAGNOSIS — R600 Localized edema: Secondary | ICD-10-CM | POA: Diagnosis not present

## 2021-12-04 MED ORDER — DOXYCYCLINE HYCLATE 100 MG PO CAPS: 100.0000 mg | ORAL_CAPSULE | Freq: Two times a day (BID) | ORAL | 0 refills | Status: AC

## 2021-12-04 MED ORDER — HYDROCHLOROTHIAZIDE 12.5 MG PO TABS: 6.2500 mg | ORAL_TABLET | Freq: Every day | ORAL | 0 refills | Status: AC

## 2021-12-04 NOTE — ED Provider Notes (Signed)
?[facility] ? ? ? ?CSN: [id number] ?Arrival date & time: 12/04/21  1518 ? ? ?  ? ?History   ?Chief Complaint ?Chief Complaint  ?Patient presents with  ? Rash  ? ? ?HPI ?Ronald [name] is a 51 y.o. male.  ? ?Patient here today for evaluation of rash to his posterior lower legs that is been present for several weeks.  Per patient and wife he has had swelling of his lower legs for a significant amount of time.  He has not seen PCP recently as wife reports that he makes appointments but then cancels him.  He denies any chest pain or shortness of breath.  He denies any significant pain in his legs.  He has not had fever. ? ?The history is provided by the patient.  ?Rash ?Associated symptoms: no fever   ? ?Past Medical History:  ?Diagnosis Date  ? Asthma   ? ? ?Patient Active Problem List  ? Diagnosis Date Noted  ? OBESITY, UNSPECIFIED 05/01/2010  ? VALVULAR HEART DISEASE 05/01/2010  ? HEART MURMUR, SYSTOLIC 05/01/2010  ? ? ?Past Surgical History:  ?Procedure Laterality Date  ? CARDIAC VALVE SURGERY    ? LITHOTRIPSY    ? TONSILLECTOMY    ? ? ? ? ? ?Home Medications   ? ?Prior to Admission medications   ?Medication Sig Start Date End Date Taking? Authorizing Provider  ?doxycycline (VIBRAMYCIN) 100 MG capsule Take 1 capsule (100 mg total) by mouth 2 (two) times daily. 12/04/21  Yes Tomi Bamberger, PA-C  ?hydrochlorothiazide (HYDRODIURIL) 12.5 MG tablet Take 0.5 tablets (6.25 mg total) by mouth daily. 12/04/21 01/03/22 Yes Tomi Bamberger, PA-C  ?HYDROcodone-acetaminophen (NORCO/VICODIN) 5-325 MG tablet Take 1-2 tablets by mouth every 6 (six) hours as needed. 01/21/18   Roxy Horseman, PA-C  ?predniSONE (DELTASONE) 20 MG tablet Take 2 tablets (40 mg total) by mouth daily. 01/21/18   Roxy Horseman, PA-C  ? ? ?Family History ?Family History  ?Problem Relation Age of Onset  ? COPD Mother   ? Cancer Father   ? ? ?Social History ?Social History  ? ?Tobacco Use  ? Smoking status: Never  ? Smokeless tobacco: Never   ?Substance Use Topics  ? Alcohol use: No  ? Drug use: No  ? ? ? ?Allergies   ?Patient has no known allergies. ? ? ?Review of Systems ?Review of Systems  ?Constitutional:  Negative for chills and fever.  ?Eyes:  Negative for discharge and redness.  ?Cardiovascular:  Positive for leg swelling.  ?Skin:  Positive for color change and rash.  ?Neurological:  Negative for numbness.  ? ? ?Physical Exam ?Triage Vital Signs ?ED Triage Vitals  ?Enc Vitals Group  ?   BP 12/04/21 1632 124/86  ?   Pulse Rate 12/04/21 1632 95  ?   Resp 12/04/21 1632 19  ?   Temp 12/04/21 1632 98 ?F (36.7 ?C)  ?   Temp src --   ?   SpO2 12/04/21 1632 97 %  ?   Weight --   ?   Height --   ?   Head Circumference --   ?   Peak Flow --   ?   Pain Score 12/04/21 1630 0  ?   Pain Loc --   ?   Pain Edu? --   ?   Excl. in GC? --   ? ?No data found. ? ?Updated Vital Signs ?BP 124/86   Pulse 95   Temp 98 ?F (36.7 ?C)  Resp 19   SpO2 97%  ?   ? ?Physical Exam ?Vitals and nursing note reviewed.  ?Constitutional:   ?   General: He is not in acute distress. ?   Appearance: Normal appearance. He is not ill-appearing.  ?HENT:  ?   Head: Normocephalic and atraumatic.  ?Eyes:  ?   Conjunctiva/sclera: Conjunctivae normal.  ?Cardiovascular:  ?   Rate and Rhythm: Normal rate.  ?Pulmonary:  ?   Effort: Pulmonary effort is normal.  ?Musculoskeletal:  ?   Right lower leg: Edema (2 + pitting) present.  ?   Left lower leg: Edema (2 + pitting) present.  ?Skin: ?   Comments: Mildly erythematous papular lesions to posterior lower legs  ?Neurological:  ?   Mental Status: He is alert.  ?Psychiatric:     ?   Mood and Affect: Mood normal.     ?   Behavior: Behavior normal.     ?   Thought Content: Thought content normal.  ? ? ? ?UC Treatments / Results  ?Labs ?(all labs ordered are listed, but only abnormal results are displayed) ?Labs Reviewed - No data to display ? ?EKG ? ? ?Radiology ?No results found. ? ?Procedures ?Procedures (including critical care  time) ? ?Medications Ordered in UC ?Medications - No data to display ? ?Initial Impression / Assessment and Plan / UC Course  ?I have reviewed the triage vital signs and the nursing notes. ? ?Pertinent labs & imaging results that were available during my care of the patient were reviewed by me and considered in my medical decision making (see chart for details). ? ?  ?Discussed that rash is likely related to longstanding edema and urged patient to make appointment with his PCP as soon as possible as he needs labs, etc. I will trial low dose HCTZ given normal BP and also antibiotic to cover cellulitis.  ? ?Final Clinical Impressions(s) / UC Diagnoses  ? ?Final diagnoses:  ?Lower leg edema  ? ?Discharge Instructions   ?None ?  ? ?ED Prescriptions   ? ? Medication Sig Dispense Auth. Provider  ? doxycycline (VIBRAMYCIN) 100 MG capsule Take 1 capsule (100 mg total) by mouth 2 (two) times daily. 20 capsule Tomi Bamberger, PA-C  ? hydrochlorothiazide (HYDRODIURIL) 12.5 MG tablet Take 0.5 tablets (6.25 mg total) by mouth daily. 15 tablet Tomi Bamberger, PA-C  ? ?  ? ?PDMP not reviewed this encounter. ?  ?Tomi Bamberger, PA-C ?12/04/21 1731 ? ?

## 2021-12-04 NOTE — ED Triage Notes (Addendum)
Pt presents with complaints of rash to back of legs x 1 year. Reports the areas have blisters on them that pop. ?

## 2022-03-11 ENCOUNTER — Other Ambulatory Visit: Payer: Self-pay

## 2022-03-11 ENCOUNTER — Inpatient Hospital Stay (HOSPITAL_COMMUNITY)
Admission: EM | Admit: 2022-03-11 | Discharge: 2022-03-26 | DRG: 308 | Disposition: E | Payer: Medicare (Managed Care) | Attending: Cardiology | Admitting: Cardiology

## 2022-03-11 ENCOUNTER — Emergency Department (HOSPITAL_COMMUNITY): Payer: Medicare (Managed Care)

## 2022-03-11 DIAGNOSIS — I272 Pulmonary hypertension, unspecified: Secondary | ICD-10-CM | POA: Diagnosis present

## 2022-03-11 DIAGNOSIS — L8990 Pressure ulcer of unspecified site, unspecified stage: Secondary | ICD-10-CM | POA: Insufficient documentation

## 2022-03-11 DIAGNOSIS — Z825 Family history of asthma and other chronic lower respiratory diseases: Secondary | ICD-10-CM | POA: Diagnosis not present

## 2022-03-11 DIAGNOSIS — I472 Ventricular tachycardia, unspecified: Principal | ICD-10-CM

## 2022-03-11 DIAGNOSIS — Z950 Presence of cardiac pacemaker: Secondary | ICD-10-CM

## 2022-03-11 DIAGNOSIS — Z953 Presence of xenogenic heart valve: Secondary | ICD-10-CM

## 2022-03-11 DIAGNOSIS — Z7982 Long term (current) use of aspirin: Secondary | ICD-10-CM | POA: Diagnosis not present

## 2022-03-11 DIAGNOSIS — Z79899 Other long term (current) drug therapy: Secondary | ICD-10-CM | POA: Diagnosis not present

## 2022-03-11 DIAGNOSIS — Z6841 Body Mass Index (BMI) 40.0 and over, adult: Secondary | ICD-10-CM | POA: Diagnosis not present

## 2022-03-11 DIAGNOSIS — Z66 Do not resuscitate: Secondary | ICD-10-CM | POA: Diagnosis not present

## 2022-03-11 DIAGNOSIS — Z515 Encounter for palliative care: Secondary | ICD-10-CM | POA: Diagnosis not present

## 2022-03-11 DIAGNOSIS — I5043 Acute on chronic combined systolic (congestive) and diastolic (congestive) heart failure: Secondary | ICD-10-CM | POA: Diagnosis present

## 2022-03-11 DIAGNOSIS — I509 Heart failure, unspecified: Secondary | ICD-10-CM | POA: Diagnosis not present

## 2022-03-11 DIAGNOSIS — E6609 Other obesity due to excess calories: Secondary | ICD-10-CM | POA: Diagnosis present

## 2022-03-11 DIAGNOSIS — N179 Acute kidney failure, unspecified: Secondary | ICD-10-CM | POA: Diagnosis present

## 2022-03-11 DIAGNOSIS — K7200 Acute and subacute hepatic failure without coma: Secondary | ICD-10-CM | POA: Diagnosis present

## 2022-03-11 DIAGNOSIS — R570 Cardiogenic shock: Secondary | ICD-10-CM | POA: Diagnosis present

## 2022-03-11 DIAGNOSIS — I4721 Torsades de pointes: Secondary | ICD-10-CM | POA: Diagnosis present

## 2022-03-11 DIAGNOSIS — F79 Unspecified intellectual disabilities: Secondary | ICD-10-CM | POA: Diagnosis present

## 2022-03-11 DIAGNOSIS — J45909 Unspecified asthma, uncomplicated: Secondary | ICD-10-CM | POA: Diagnosis present

## 2022-03-11 DIAGNOSIS — E8729 Other acidosis: Secondary | ICD-10-CM | POA: Diagnosis not present

## 2022-03-11 DIAGNOSIS — I4729 Other ventricular tachycardia: Secondary | ICD-10-CM | POA: Diagnosis present

## 2022-03-11 DIAGNOSIS — R000 Tachycardia, unspecified: Secondary | ICD-10-CM | POA: Diagnosis present

## 2022-03-11 DIAGNOSIS — Z809 Family history of malignant neoplasm, unspecified: Secondary | ICD-10-CM

## 2022-03-11 DIAGNOSIS — E782 Mixed hyperlipidemia: Secondary | ICD-10-CM | POA: Diagnosis present

## 2022-03-11 DIAGNOSIS — N189 Chronic kidney disease, unspecified: Secondary | ICD-10-CM | POA: Diagnosis present

## 2022-03-11 DIAGNOSIS — I452 Bifascicular block: Secondary | ICD-10-CM | POA: Diagnosis present

## 2022-03-11 LAB — CBC WITH DIFFERENTIAL/PLATELET
Abs Immature Granulocytes: 0.31 10*3/uL — ABNORMAL HIGH (ref 0.00–0.07)
Basophils Absolute: 0.1 10*3/uL (ref 0.0–0.1)
Basophils Relative: 1 %
Eosinophils Absolute: 0.2 10*3/uL (ref 0.0–0.5)
Eosinophils Relative: 1 %
HCT: 34.4 % — ABNORMAL LOW (ref 39.0–52.0)
Hemoglobin: 10.1 g/dL — ABNORMAL LOW (ref 13.0–17.0)
Immature Granulocytes: 3 %
Lymphocytes Relative: 18 %
Lymphs Abs: 2.2 10*3/uL (ref 0.7–4.0)
MCH: 25.8 pg — ABNORMAL LOW (ref 26.0–34.0)
MCHC: 29.4 g/dL — ABNORMAL LOW (ref 30.0–36.0)
MCV: 87.8 fL (ref 80.0–100.0)
Monocytes Absolute: 0.9 10*3/uL (ref 0.1–1.0)
Monocytes Relative: 8 %
Neutro Abs: 8.3 10*3/uL — ABNORMAL HIGH (ref 1.7–7.7)
Neutrophils Relative %: 69 %
Platelets: 363 10*3/uL (ref 150–400)
RBC: 3.92 MIL/uL — ABNORMAL LOW (ref 4.22–5.81)
RDW: 17.3 % — ABNORMAL HIGH (ref 11.5–15.5)
WBC: 12 10*3/uL — ABNORMAL HIGH (ref 4.0–10.5)
nRBC: 0 % (ref 0.0–0.2)

## 2022-03-11 LAB — BASIC METABOLIC PANEL
Anion gap: 16 — ABNORMAL HIGH (ref 5–15)
BUN: 26 mg/dL — ABNORMAL HIGH (ref 6–20)
CO2: 24 mmol/L (ref 22–32)
Calcium: 8.8 mg/dL — ABNORMAL LOW (ref 8.9–10.3)
Chloride: 95 mmol/L — ABNORMAL LOW (ref 98–111)
Creatinine, Ser: 2.77 mg/dL — ABNORMAL HIGH (ref 0.61–1.24)
GFR, Estimated: 27 mL/min — ABNORMAL LOW (ref >60)
Glucose, Bld: 131 mg/dL — ABNORMAL HIGH (ref 70–99)
Potassium: 4.9 mmol/L (ref 3.5–5.1)
Sodium: 135 mmol/L (ref 135–145)

## 2022-03-11 LAB — URINALYSIS, COMPLETE (UACMP) WITH MICROSCOPIC
Bilirubin Urine: NEGATIVE
Glucose, UA: 150 mg/dL — AB
Ketones, ur: NEGATIVE mg/dL
Nitrite: NEGATIVE
Protein, ur: 30 mg/dL — AB
RBC / HPF: >50 RBC/hpf — ABNORMAL HIGH (ref 0–5)
Specific Gravity, Urine: 1.009 (ref 1.005–1.030)
pH: 5 (ref 5.0–8.0)

## 2022-03-11 LAB — I-STAT CHEM 8, ED
BUN: 23 mg/dL — ABNORMAL HIGH (ref 6–20)
Calcium, Ion: 1 mmol/L — ABNORMAL LOW (ref 1.15–1.40)
Chloride: 101 mmol/L (ref 98–111)
Creatinine, Ser: 2.3 mg/dL — ABNORMAL HIGH (ref 0.61–1.24)
Glucose, Bld: 212 mg/dL — ABNORMAL HIGH (ref 70–99)
HCT: 33 % — ABNORMAL LOW (ref 39.0–52.0)
Hemoglobin: 11.2 g/dL — ABNORMAL LOW (ref 13.0–17.0)
Potassium: 3.6 mmol/L (ref 3.5–5.1)
Sodium: 136 mmol/L (ref 135–145)
TCO2: 20 mmol/L — ABNORMAL LOW (ref 22–32)

## 2022-03-11 LAB — TSH: TSH: 3.574 u[IU]/mL (ref 0.350–4.500)

## 2022-03-11 LAB — HIV ANTIBODY (ROUTINE TESTING W REFLEX): HIV Screen 4th Generation wRfx: NONREACTIVE

## 2022-03-11 LAB — TROPONIN I (HIGH SENSITIVITY)
Troponin I (High Sensitivity): 440 ng/L (ref <18)
Troponin I (High Sensitivity): 662 ng/L (ref <18)

## 2022-03-11 LAB — COMPREHENSIVE METABOLIC PANEL
ALT: 32 U/L (ref 0–44)
AST: 33 U/L (ref 15–41)
Albumin: 2.8 g/dL — ABNORMAL LOW (ref 3.5–5.0)
Alkaline Phosphatase: 67 U/L (ref 38–126)
Anion gap: 24 — ABNORMAL HIGH (ref 5–15)
BUN: 24 mg/dL — ABNORMAL HIGH (ref 6–20)
CO2: 17 mmol/L — ABNORMAL LOW (ref 22–32)
Calcium: 9 mg/dL (ref 8.9–10.3)
Chloride: 97 mmol/L — ABNORMAL LOW (ref 98–111)
Creatinine, Ser: 2.29 mg/dL — ABNORMAL HIGH (ref 0.61–1.24)
GFR, Estimated: 34 mL/min — ABNORMAL LOW (ref >60)
Glucose, Bld: 204 mg/dL — ABNORMAL HIGH (ref 70–99)
Potassium: 3.7 mmol/L (ref 3.5–5.1)
Sodium: 138 mmol/L (ref 135–145)
Total Bilirubin: 0.7 mg/dL (ref 0.3–1.2)
Total Protein: 6.4 g/dL — ABNORMAL LOW (ref 6.5–8.1)

## 2022-03-11 LAB — HEPARIN LEVEL (UNFRACTIONATED): Heparin Unfractionated: <0.1 IU/mL — ABNORMAL LOW (ref 0.30–0.70)

## 2022-03-11 LAB — MAGNESIUM
Magnesium: 2 mg/dL (ref 1.7–2.4)
Magnesium: 2.1 mg/dL (ref 1.7–2.4)

## 2022-03-11 LAB — LACTIC ACID, PLASMA
Lactic Acid, Venous: 3.7 mmol/L (ref 0.5–1.9)
Lactic Acid, Venous: 2.9 mmol/L (ref 0.5–1.9)

## 2022-03-11 LAB — MRSA NEXT GEN BY PCR, NASAL: MRSA by PCR Next Gen: NOT DETECTED

## 2022-03-11 LAB — BRAIN NATRIURETIC PEPTIDE: B Natriuretic Peptide: 965.7 pg/mL — ABNORMAL HIGH (ref 0.0–100.0)

## 2022-03-11 LAB — PROTIME-INR
INR: 1.4 — ABNORMAL HIGH (ref 0.8–1.2)
Prothrombin Time: 16.8 seconds — ABNORMAL HIGH (ref 11.4–15.2)

## 2022-03-11 MED ORDER — METOCLOPRAMIDE HCL 5 MG/ML IJ SOLN
10.0000 mg | Freq: Once | INTRAMUSCULAR | Status: AC
  Administered 2022-03-11: 10 mg via INTRAVENOUS
  Filled 2022-03-11: qty 2

## 2022-03-11 MED ORDER — POTASSIUM CHLORIDE 10 MEQ/100ML IV SOLN
10.0000 meq | Freq: Once | INTRAVENOUS | Status: AC
  Administered 2022-03-11: 10 meq via INTRAVENOUS
  Filled 2022-03-11: qty 100

## 2022-03-11 MED ORDER — SODIUM CHLORIDE 0.9% FLUSH
3.0000 mL | Freq: Two times a day (BID) | INTRAVENOUS | Status: DC
Start: 2022-03-11 — End: 2022-03-12
  Administered 2022-03-11: 3 mL via INTRAVENOUS

## 2022-03-11 MED ORDER — FUROSEMIDE 10 MG/ML IJ SOLN
40.0000 mg | Freq: Once | INTRAMUSCULAR | Status: AC
  Administered 2022-03-11: 40 mg via INTRAVENOUS
  Filled 2022-03-11: qty 4

## 2022-03-11 MED ORDER — SODIUM CHLORIDE 0.9 % IV SOLN
250.0000 mL | INTRAVENOUS | Status: DC | PRN
  Administered 2022-03-12: 250 mL via INTRAVENOUS

## 2022-03-11 MED ORDER — AMIODARONE HCL IN DEXTROSE 360-4.14 MG/200ML-% IV SOLN
60.0000 mg/h | INTRAVENOUS | Status: AC
  Administered 2022-03-11: 60 mg/h via INTRAVENOUS
  Filled 2022-03-11: qty 200

## 2022-03-11 MED ORDER — SODIUM CHLORIDE 0.9 % IV BOLUS
250.0000 mL | Freq: Once | INTRAVENOUS | Status: AC
  Administered 2022-03-11: 250 mL via INTRAVENOUS

## 2022-03-11 MED ORDER — AMIODARONE HCL 150 MG/3ML IV SOLN
300.0000 mg | Freq: Once | INTRAVENOUS | Status: AC
  Administered 2022-03-11: 300 mg via INTRAVENOUS
  Filled 2022-03-11: qty 6

## 2022-03-11 MED ORDER — FUROSEMIDE 10 MG/ML IJ SOLN
80.0000 mg | Freq: Once | INTRAMUSCULAR | Status: AC
  Administered 2022-03-11: 80 mg via INTRAVENOUS
  Filled 2022-03-11: qty 8

## 2022-03-11 MED ORDER — ONDANSETRON HCL 4 MG/2ML IJ SOLN
4.0000 mg | Freq: Once | INTRAMUSCULAR | Status: AC
Start: 2022-03-11 — End: 2022-03-11

## 2022-03-11 MED ORDER — ACETAMINOPHEN 325 MG PO TABS: 650.0000 mg | ORAL_TABLET | ORAL | Status: DC | PRN

## 2022-03-11 MED ORDER — HEPARIN BOLUS VIA INFUSION
3000.0000 [IU] | Freq: Once | INTRAVENOUS | Status: AC
  Administered 2022-03-11: 3000 [IU] via INTRAVENOUS
  Filled 2022-03-11: qty 3000

## 2022-03-11 MED ORDER — DAPAGLIFLOZIN PROPANEDIOL 10 MG PO TABS
10.0000 mg | ORAL_TABLET | Freq: Every day | ORAL | Status: DC
  Administered 2022-03-11: 10 mg via ORAL
  Filled 2022-03-11 (×2): qty 1

## 2022-03-11 MED ORDER — HEPARIN BOLUS VIA INFUSION
4000.0000 [IU] | Freq: Once | INTRAVENOUS | Status: AC
Start: 2022-03-11 — End: 2022-03-11
  Administered 2022-03-11: 4000 [IU] via INTRAVENOUS
  Filled 2022-03-11: qty 4000

## 2022-03-11 MED ORDER — SODIUM CHLORIDE 0.9% FLUSH: 3.0000 mL | INTRAVENOUS | Status: DC | PRN

## 2022-03-11 MED ORDER — AMIODARONE HCL IN DEXTROSE 360-4.14 MG/200ML-% IV SOLN
INTRAVENOUS | Status: AC
  Administered 2022-03-11: 60 mg/h
  Filled 2022-03-11: qty 200

## 2022-03-11 MED ORDER — ASPIRIN 81 MG PO TBEC
81.0000 mg | DELAYED_RELEASE_TABLET | Freq: Every day | ORAL | Status: DC
  Administered 2022-03-11: 81 mg via ORAL
  Filled 2022-03-11: qty 1

## 2022-03-11 MED ORDER — ONDANSETRON HCL 4 MG/2ML IJ SOLN
INTRAMUSCULAR | Status: AC
  Administered 2022-03-11: 4 mg via INTRAVENOUS
  Filled 2022-03-11: qty 2

## 2022-03-11 MED ORDER — HEPARIN (PORCINE) 25000 UT/250ML-% IV SOLN
1800.0000 [IU]/h | INTRAVENOUS | Status: DC
  Administered 2022-03-11 (×2): 1400 [IU]/h via INTRAVENOUS
  Administered 2022-03-12: 1800 [IU]/h via INTRAVENOUS
  Filled 2022-03-11 (×2): qty 250

## 2022-03-11 MED ORDER — AMIODARONE HCL IN DEXTROSE 360-4.14 MG/200ML-% IV SOLN
30.0000 mg/h | INTRAVENOUS | Status: DC
Start: 2022-03-11 — End: 2022-03-12
  Administered 2022-03-11: 30 mg/h via INTRAVENOUS
  Filled 2022-03-11: qty 200

## 2022-03-11 MED ORDER — ORAL CARE MOUTH RINSE: 15.0000 mL | OROMUCOSAL | Status: DC | PRN

## 2022-03-11 MED ORDER — ONDANSETRON HCL 4 MG/2ML IJ SOLN: 4.0000 mg | Freq: Four times a day (QID) | INTRAMUSCULAR | Status: DC | PRN

## 2022-03-11 NOTE — Progress Notes (Signed)
ANTICOAGULATION CONSULT NOTE - Initial Consult  Pharmacy Consult for Heparin infusion Indication: chest pain/ACS  No Known Allergies  Patient Measurements: Height: 5\' 7"  (170.2 cm) Weight: (!) 147 kg (324 lb 1.2 oz) IBW/kg (Calculated) : 66.1 Heparin Dosing Weight: 101.9 kg  Vital Signs: Temp: 97.8 F (36.6 C) (07/17 1140) Temp Source: Oral (07/17 1140) BP: 125/92 (07/17 1230) Pulse Rate: 94 (07/17 1230)  Labs: Recent Labs    04-02-2022 1130 04-02-2022 1137  HGB 10.1* 11.2*  HCT 34.4* 33.0*  PLT 363  --   LABPROT 16.8*  --   INR 1.4*  --   CREATININE  --  2.30*    Estimated Creatinine Clearance: 54.1 mL/min (A) (by C-G formula based on SCr of 2.3 mg/dL (H)).   Medical History: Past Medical History:  Diagnosis Date   Asthma     Medications:  (Not in a hospital admission)   Assessment: 51 yo M BIB [facility] in [place] w/ HR in 230s on arrival. Pt received adenosine x2 doses, without change. Pt received 150mg  amiodarone bolus by EMS and another 300mg  in ED followed by initiation of amiodarone infusion. Pt cardioverted s/p 300mg  amiodarone bolus. Patient was not taking any anticoagulants prior to admission. Pharmacy consulted to dose heparin infusion for ACS / STEMI.   Hgb 11.2 Plt 363 Troponin pending  Goal of Therapy:  Heparin level 0.3-0.7 units/ml Monitor platelets by anticoagulation protocol: Yes   Plan:  Give 4000 units bolus x 1 Start heparin infusion at 1400 units/hr Check heparin level in 6 hours Monitor daily CBC, heparin level, and s/sx of bleeding  51 04/02/2022,12:44 PM

## 2022-03-11 NOTE — ED Provider Notes (Signed)
[facility] EMERGENCY DEPARTMENT Provider Note   CSN: [id number] Arrival date & time: 03/19/2022  1129     History  Chief Complaint  Patient presents with   V-tach    Ronald [name] is a 51 y.o. male.  HPI 51 year old male presents with ventricular tachycardia.  History is initially from EMS and then the patient.  Called out to EMS for heart palpitations this morning.  Found to have ventricular tachycardia.  He received 150 mg amiodarone drip this seem like it was helping.  The patient was otherwise awake and alert and stable blood pressure.  As they arrived to the emergency department and were checking in at the bridge the patient went unconscious and was in ventricular tachycardia.  He was defibrillated.  He is now awake and talking to me and feels poorly and can still feel palpitations.  He denies chest pain but has been feeling short of breath since this morning.  About 1 month ago he received an aortic valve replacement at [facility].  He states has been doing well since then.  He has chronic leg swelling but feels like this been getting worse recently.   Home Medications Prior to Admission medications   Medication Sig Start Date End Date Taking? Authorizing Provider  ASPIRIN LOW DOSE 81 MG tablet Take 81 mg by mouth daily. 03/01/22  Yes [provider]  FEROSUL 325 (65 Fe) MG tablet Take 325 mg by mouth daily. 12/24/21  Yes [provider]  metoprolol succinate (TOPROL-XL) 25 MG 24 hr tablet Take 25 mg by mouth daily. 03/01/22  Yes [provider]  polyethylene glycol powder (GLYCOLAX/MIRALAX) 17 GM/SCOOP powder See admin instructions. Take 1 capful by mouth daily. 03/01/22  Yes [provider]  spironolactone (ALDACTONE) 25 MG tablet Take 25 mg by mouth daily. 03/01/22  Yes [provider]  torsemide (DEMADEX) 20 MG tablet Take 20 mg by mouth 2 (two) times daily. 03/01/22  Yes [provider]  doxycycline  (VIBRAMYCIN) 100 MG capsule Take 1 capsule (100 mg total) by mouth 2 (two) times daily. Patient not taking: Reported on 02/25/2022 12/04/21   Francene Finders, PA-C  hydrochlorothiazide (HYDRODIURIL) 12.5 MG tablet Take 0.5 tablets (6.25 mg total) by mouth daily. Patient not taking: Reported on 02/28/2022 12/04/21 01/03/22  Francene Finders, PA-C  HYDROcodone-acetaminophen (NORCO/VICODIN) 5-325 MG tablet Take 1-2 tablets by mouth every 6 (six) hours as needed. Patient not taking: Reported on 03/10/2022 01/21/18   Montine Circle, PA-C  predniSONE (DELTASONE) 20 MG tablet Take 2 tablets (40 mg total) by mouth daily. Patient not taking: Reported on 03/20/2022 01/21/18   Montine Circle, PA-C      Allergies    Patient has no known allergies.    Review of Systems   Review of Systems  Unable to perform ROS: Acuity of condition    Physical Exam Updated Vital Signs BP 123/83   Pulse 76   Temp 97.9 F (36.6 C) (Oral)   Resp (!) 21   Ht 5\' 7"  (1.702 m)   Wt (!) 147 kg   SpO2 100%   BMI 50.76 kg/m  Physical Exam Vitals and nursing note reviewed.  Constitutional:      General: He is in acute distress.     Appearance: He is well-developed. He is obese. He is ill-appearing and diaphoretic.  HENT:     Head: Normocephalic and atraumatic.  Cardiovascular:     Rate and Rhythm: Regular rhythm. Tachycardia present.  Heart sounds: Normal heart sounds.  Pulmonary:     Effort: Pulmonary effort is normal.     Breath sounds: Normal breath sounds. No wheezing.  Abdominal:     Palpations: Abdomen is soft.     Tenderness: There is no abdominal tenderness.  Musculoskeletal:     Right lower leg: Edema present.     Left lower leg: Edema present.     Comments: Bilateral lower extremity edema with some stasis dermatitis.  Skin:    General: Skin is warm.  Neurological:     Mental Status: He is alert.     ED Results / Procedures / Treatments   Labs (all labs ordered are listed, but only abnormal  results are displayed) Labs Reviewed  COMPREHENSIVE METABOLIC PANEL - Abnormal; Notable for the following components:      Result Value   Chloride 97 (*)    CO2 17 (*)    Glucose, Bld 204 (*)    BUN 24 (*)    Creatinine, Ser 2.29 (*)    Total Protein 6.4 (*)    Albumin 2.8 (*)    GFR, Estimated 34 (*)    Anion gap 24 (*)    All other components within normal limits  BRAIN NATRIURETIC PEPTIDE - Abnormal; Notable for the following components:   B Natriuretic Peptide 965.7 (*)    All other components within normal limits  CBC WITH DIFFERENTIAL/PLATELET - Abnormal; Notable for the following components:   WBC 12.0 (*)    RBC 3.92 (*)    Hemoglobin 10.1 (*)    HCT 34.4 (*)    MCH 25.8 (*)    MCHC 29.4 (*)    RDW 17.3 (*)    Neutro Abs 8.3 (*)    Abs Immature Granulocytes 0.31 (*)    All other components within normal limits  PROTIME-INR - Abnormal; Notable for the following components:   Prothrombin Time 16.8 (*)    INR 1.4 (*)    All other components within normal limits  I-STAT CHEM 8, ED - Abnormal; Notable for the following components:   BUN 23 (*)    Creatinine, Ser 2.30 (*)    Glucose, Bld 212 (*)    Calcium, Ion 1.00 (*)    TCO2 20 (*)    Hemoglobin 11.2 (*)    HCT 33.0 (*)    All other components within normal limits  TROPONIN I (HIGH SENSITIVITY) - Abnormal; Notable for the following components:   Troponin I (High Sensitivity) 440 (*)    All other components within normal limits  TROPONIN I (HIGH SENSITIVITY) - Abnormal; Notable for the following components:   Troponin I (High Sensitivity) 662 (*)    All other components within normal limits  MAGNESIUM  MAGNESIUM  TSH  HEPARIN LEVEL (UNFRACTIONATED)  HIV ANTIBODY (ROUTINE TESTING W REFLEX)  LACTIC ACID, PLASMA  LACTIC ACID, PLASMA  URINALYSIS, COMPLETE (UACMP) WITH MICROSCOPIC    EKG EKG Interpretation  Date/Time:  Monday April 08, 2022 12:10:58 EDT Ventricular Rate:  93 PR Interval:  135 QRS  Duration: 183 QT Interval:  464 QTC Calculation: 578 R Axis:   -61 Text Interpretation: Sinus or ectopic atrial rhythm Nonspecific IVCD with LAD ST depressions similar to earlier in the day Confirmed by Pricilla Loveless [phone]) on 2022/04/08 12:35:56 PM  Radiology DG Chest Portable 1 View  Result Date: 2022/04/08 CLINICAL DATA:  Ventricular tachycardia.  Postop CABG 1 month ago. EXAM: PORTABLE CHEST 1 VIEW COMPARISON:  Radiographs 12/05/2021. FINDINGS: 1145 hours.  Interval revised median sternotomy and left subclavian pacemaker placement with leads projecting over the right atrium and right ventricle. The heart is mildly enlarged. There is mild vascular congestion without overt pulmonary edema. No confluent airspace opacity, pleural effusion or pneumothorax. No acute osseous findings are evident. Telemetry leads overlie the chest. IMPRESSION: Interval median sternotomy and pacemaker placement. Cardiomegaly and vascular congestion without definite acute abnormality. Electronically Signed   By: Richardean Sale M.D.   On: 03/20/2022 11:56    Procedures .Critical Care  Performed by: Sherwood Gambler, MD Authorized by: Sherwood Gambler, MD   Critical care provider statement:    Critical care time (minutes):  45   Critical care time was exclusive of:  Separately billable procedures and treating other patients   Critical care was necessary to treat or prevent imminent or life-threatening deterioration of the following conditions:  Cardiac failure   Critical care was time spent personally by me on the following activities:  Development of treatment plan with patient or surrogate, discussions with consultants, evaluation of patient's response to treatment, examination of patient, ordering and review of laboratory studies, ordering and review of radiographic studies, ordering and performing treatments and interventions, pulse oximetry, re-evaluation of patient's condition and review of old charts      Medications Ordered in ED Medications  amiodarone (NEXTERONE PREMIX) 360-4.14 MG/200ML-% (1.8 mg/mL) IV infusion (60 mg/hr Intravenous New Bag/Given 03/20/2022 1135)    Followed by  amiodarone (NEXTERONE PREMIX) 360-4.14 MG/200ML-% (1.8 mg/mL) IV infusion (has no administration in time range)  heparin ADULT infusion 100 units/mL (25000 units/231mL) (1,400 Units/hr Intravenous New Bag/Given 03/15/2022 1305)  sodium chloride flush (NS) 0.9 % injection 3 mL (3 mLs Intravenous Not Given 03/10/2022 1443)  sodium chloride flush (NS) 0.9 % injection 3 mL (has no administration in time range)  0.9 %  sodium chloride infusion (has no administration in time range)  acetaminophen (TYLENOL) tablet 650 mg (has no administration in time range)  ondansetron (ZOFRAN) injection 4 mg (has no administration in time range)  aspirin EC tablet 81 mg (81 mg Oral Given 03/10/2022 1455)  dapagliflozin propanediol (FARXIGA) tablet 10 mg (10 mg Oral Given 03/14/2022 1456)  ondansetron (ZOFRAN) injection 4 mg (4 mg Intravenous Given 03/10/2022 1134)  amiodarone (CORDARONE) injection 300 mg (300 mg Intravenous Given 02/27/2022 1115)  potassium chloride 10 mEq in 100 mL IVPB (0 mEq Intravenous Stopped 03/01/2022 1442)  sodium chloride 0.9 % bolus 250 mL (0 mLs Intravenous Stopped 02/27/2022 1237)  metoCLOPramide (REGLAN) injection 10 mg (10 mg Intravenous Given 03/22/2022 1222)  furosemide (LASIX) injection 40 mg (40 mg Intravenous Given 03/22/2022 1233)  heparin bolus via infusion 4,000 Units (4,000 Units Intravenous Bolus from Bag 02/23/2022 1307)  furosemide (LASIX) injection 80 mg (80 mg Intravenous Given 03/05/2022 1522)    ED Course/ Medical Decision Making/ A&P Clinical Course as of 03/05/2022 1600  Mon Mar 11, 2022  1208 I discussed case with Dr. Terri Skains. ST depressions are concerning. He would like a repeat EKG and will consult, may need cath lab [SG]  1222 Discussed with Dr. Terri Skains and Dr. Virgina Jock at bedside.  Based on his recent clean heart  cath and his known history of significant CHF, feel the V. tach is more based on CHF.  They will hold off on the Cath Lab, especially with his AKI.  Recommend heparin, Lasix, and continue amiodarone drip.  He will be admitted to cardiac ICU. [SG]  1236 Dr. Terri Skains asks for Medical ICU admit based on wounds  to lower extremity, AKI, comorbidities. Will consult [SG]  1251 ICU notes this is more of a cardiac problem.  Discussed again with Dr. Odis Hollingshead, who will admit [SG]    Clinical Course User Index [SG] Pricilla Loveless, MD                           Medical Decision Making Amount and/or Complexity of Data Reviewed External Data Reviewed: labs and notes.    Details: Recent aortic valve replacement and aortoplasty.  Significant CHF on echoes. Labs: ordered.    Details: Potassium on the low end of normal at 3.6, given oral potassium given ventricular tachycardia.  Kidney function worse than when he was discharged from [facility]. Radiology: ordered and independent interpretation performed.    Details: Mild pulmonary edema ECG/medicine tests: ordered and independent interpretation performed.    Details: Ventricular tachycardia.  After this resolved, some ST depressions diffusely  Risk Prescription drug management. Decision regarding hospitalization.   Amiodarone seem to take patient out of ventricular tachycardia.  He still endorsing some shortness of breath which is probably from CHF, especially with his progressive leg swelling for several weeks.  Cardiology consulted and ultimately they will admit.  He was given IV diuresis and will be kept on amiodarone.  Otherwise from a respiratory standpoint he seems to be tolerating some mild nasal cannula oxygenation well and I do not think he needs BiPAP.  Troponin elevation could be ACS but is more likely related to the ventricular tachycardia and subsequent cardiac dysfunction from that.        Final Clinical Impression(s) / ED Diagnoses Final  diagnoses:  Ventricular tachycardia (HCC)  Acute kidney injury [facility])    Rx / DC Orders ED Discharge Orders     None         Pricilla Loveless, MD 03/04/2022 1601

## 2022-03-11 NOTE — ED Notes (Signed)
Attempted report to ICU x 1.

## 2022-03-11 NOTE — ED Notes (Signed)
Date and time results received: 04-10-2022 1620 (use smartphrase ".now" to insert current time)  Test: lactic acid Critical Value: 3.7  Name of Provider Notified: Tolia  Orders Received? Or Actions Taken?:  N/a

## 2022-03-11 NOTE — Progress Notes (Signed)
ANTICOAGULATION CONSULT NOTE   Pharmacy Consult for Heparin Indication: chest pain/ACS  No Known Allergies  Patient Measurements: Height: 5\' 7"  (170.2 cm) Weight: (!) 147 kg (324 lb 1.2 oz) IBW/kg (Calculated) : 66.1 Heparin Dosing Weight: 101.9 kg  Vital Signs: Temp: 98.3 F (36.8 C) (07/17 2000) Temp Source: Oral (07/17 2000) BP: 103/84 (07/17 2000) Pulse Rate: 81 (07/17 2000)  Labs: Recent Labs    03/31/2022 1130 2022-03-31 1137 March 31, 2022 1430 March 31, 2022 2115  HGB 10.1* 11.2*  --   --   HCT 34.4* 33.0*  --   --   PLT 363  --   --   --   LABPROT 16.8*  --   --   --   INR 1.4*  --   --   --   HEPARINUNFRC  --   --   --  <0.10*  CREATININE 2.29* 2.30*  --   --   TROPONINIHS 440*  --  662*  --      Estimated Creatinine Clearance: 54.1 mL/min (A) (by C-G formula based on SCr of 2.3 mg/dL (H)).   Assessment: 51 yo M BIB [facility] in [place] w/ HR in 230s on arrival. Pt received adenosine x2 doses, without change. Pt received 150mg  amiodarone bolus by EMS and another 300mg  in ED followed by initiation of amiodarone infusion. Pt cardioverted s/p 300mg  amiodarone bolus. Patient was not taking any anticoagulants prior to admission. Pharmacy consulted to dose heparin infusion for ACS / STEMI.   Heparin level undetectable on infusion at 1400 units/hr. No issues with line or bleeding reported per RN.  Goal of Therapy:  Heparin level 0.3-0.7 units/ml Monitor platelets by anticoagulation protocol: Yes   Plan:  Rebolus heparin 3000 units Increase heparin infusion to 1800 units/hr Check heparin level in 8 hours  54, PharmD, BCPS Please see amion for complete clinical pharmacist phone list 2022-03-31,9:43 PM

## 2022-03-11 NOTE — H&P (Addendum)
HISTORY AND PHYSICAL  Patient ID: Ronald Ronald [name] MRN: [id number] DOB/AGE: July 10, 1971 51 y.o.  Admit date: 03/07/2022 Attending physician: Pricilla Loveless, MD Primary Physician:  Corine Shelter, MD Primary Cardiologist: Dr. Seward Speck   Chief complaint: Palpitation.   HPI:  Ronald Ronald [name] is a 51 y.o. 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  Patient gets majority of his care at [facility] but was brought in by EMS this morning to [facility], ED for symptoms of palpitation.  In route he was noted to have wide-complex tachycardia and EMS administered IV amiodarone over 10 minutes.  Upon entering [facility], ED patient was noted to be unresponsive and due to wide-complex tachycardia was defibrillated and since then has been in normal sinus.  Patient was evaluated in trauma bay 1 with ED provider, Dolores (stepmom Melina Modena), and ED staff present.  Hemodynamically patient is maintaining a blood pressure without pressor support.  Denies anginal discomfort.  Review of systems positive for shortness of breath, orthopnea, paroxysmal nocturnal dyspnea lower extremity swelling.  Majority of history of present illness is obtained by his stepmother and review of electronic medical records from [facility] as the patient is a poor historian due to baseline intellectual disability.  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  Underwent intervention at  the age of 15 years median sternotomy.  He was a redo aortic valve replacement with root repair that was done by Dr. Justice Britain in June 2023.  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  During the valve replacement surgery it appears the SVG was harvested not sure if he underwent bypass surgery.  We will have to obtain records for clarity.  Neither the patient nor Ronald Ronald [name] are aware of any bypass surgery.  Cardiology is asked to admit the patient for this hospitalization given the presentation of heart failure and wide-complex tachycardia suggestive of ventricular tach.  ALLERGIES: Ronald [name] Known Allergies  PAST MEDICAL HISTORY: Past Medical History:  Diagnosis Date   Asthma   Aortic stenosis status post bioprosthetic valve replacement. Aortic root replacement. Intellectual disability. Obesity due to excess calories. Hyperlipidemia.   PAST SURGICAL HISTORY: Past Surgical History:  Procedure Laterality Date   CARDIAC VALVE SURGERY     LITHOTRIPSY     TONSILLECTOMY    Aortic root and valve replacement June 2023 at [facility]  FAMILY HISTORY: The patient family history includes COPD in his mother; Cancer in his father. Ronald [name] significant history of premature CAD per [facility].   SOCIAL HISTORY:  The patient  reports that he has never smoked. He has never used smokeless tobacco. He reports that he does not drink alcohol and does not use drugs.  MEDICATIONS: Current Outpatient Medications  Medication Instructions   Aspirin Low Dose 81 mg, Oral, Daily   doxycycline (VIBRAMYCIN) 100 mg, Oral, 2 times daily   FeroSul 325 mg, Oral, Daily   hydrochlorothiazide (HYDRODIURIL) 6.25 mg,  Oral, Daily   HYDROcodone-acetaminophen (NORCO/VICODIN) 5-325 MG tablet 1-2 tablets, Oral, Every 6 hours PRN   metoprolol succinate (TOPROL-XL)  25 mg, Oral, Daily   polyethylene glycol powder (GLYCOLAX/MIRALAX) 17 GM/SCOOP powder See admin instructions, Take 1 capful by mouth daily.   predniSONE (DELTASONE) 40 mg, Oral, Daily   spironolactone (ALDACTONE) 25 mg, Oral, Daily   torsemide (DEMADEX) 20 mg, Oral, 2 times daily     amiodarone 60 mg/hr (03/18/2022 1135)   Followed by   amiodarone     heparin 1,400 Units/hr (03/19/2022 1305)    REVIEW OF SYSTEMS: Review of Systems  Constitutional: Positive for weight gain.       Baseline intellectual disability.  Cardiovascular:  Positive for dyspnea on exertion, irregular heartbeat, leg swelling, orthopnea and paroxysmal nocturnal dyspnea. Negative for chest pain, claudication, near-syncope, palpitations and syncope.  Respiratory:  Positive for shortness of breath.   Hematologic/Lymphatic: Negative for bleeding problem.  Musculoskeletal:  Negative for muscle cramps and myalgias.  Neurological:  Negative for dizziness and light-headedness.  All other systems reviewed and are negative.   PHYSICAL EXAM:    03/22/2022    1:30 PM 03/22/2022   12:45 PM 03/18/2022   12:40 PM  Vitals with BMI  Height   5\' 7"   Weight   324 lbs 1 oz  BMI   50.75  Systolic 126 120   Diastolic 49 69   Pulse 87 94     Ronald [name] intake or output data in the 24 hours ending 03/01/2022 1333  Net IO Since Admission: Ronald [name] IO data has been entered for this period [03/22/2022 1333]  CONSTITUTIONAL: Appears older than stated age, mild distress, hemodynamically stable.  SKIN: Skin is warm and diaphoretic. Ronald [name] rash noted. Ronald [name] cyanosis. Ronald [name] pallor. Ronald [name] jaundice HEAD: Normocephalic and atraumatic.  EYES: Ronald [name] scleral icterus MOUTH/THROAT: Moist oral membranes.  NECK: Unable to evaluate JVP due to short neck stature and obesity CHEST Normal respiratory effort. Ronald [name] intercostal retractions.  Sternotomy site is well-healed.  Left infraclavicular pacemaker site dressing present. LUNGS: Bilateral rales noted at the bases. CARDIOVASCULAR:  Regular, positive S1-S2, tachycardia, Ronald [name] murmurs rubs or gallops appreciated secondary to tachycardia. ABDOMINAL: Obese, abdominal striae, ecchymosis, positive bowel sounds in all 4 quadrants, Ronald [name] apparent ascites.  EXTREMITIES: Bilateral lower extremity swelling, ulcers noted bilateral ankles, poor skin hygiene HEMATOLOGIC: Ronald [name] significant bruising NEUROLOGIC: Awake and alert, emotionally labile, conversing with his stepmother, moving all 4 extremities PSYCHIATRIC: Normal mood and affect. Normal behavior. Cooperative  RADIOLOGY: DG Chest Portable 1 View  Result Date: 03/20/2022 CLINICAL DATA:  Ventricular tachycardia.  Postop CABG 1 month ago. EXAM: PORTABLE CHEST 1 VIEW COMPARISON:  Radiographs 12/05/2021. FINDINGS: 1145 hours. Interval revised median sternotomy and left subclavian pacemaker placement with leads projecting over the right atrium and right ventricle. The heart is mildly enlarged. There is mild vascular congestion without overt pulmonary edema. Ronald [name] confluent airspace opacity, pleural effusion or pneumothorax. Ronald [name] acute osseous findings are evident. Telemetry leads overlie the chest. IMPRESSION: Interval median sternotomy and pacemaker placement. Cardiomegaly and vascular congestion without definite acute abnormality. Electronically Signed   By: 02/04/2022 M.D.   On: 03/03/2022 11:56    LABORATORY DATA: Lab Results  Component Value Date   WBC 12.0 (H) 03/10/2022   HGB 11.2 (L) 03/10/2022   HCT 33.0 (L) 03/25/2022   MCV 87.8 02/24/2022   PLT 363 03/06/2022    Recent Labs  Lab 03/04/2022 1130 03/20/2022 1137  NA 138 136  K 3.7 3.6  CL  97* 101  CO2 17*  --   BUN 24* 23*  CREATININE 2.29* 2.30*  CALCIUM 9.0  --   PROT 6.4*  --   BILITOT 0.7  --   ALKPHOS 67  --   ALT 32  --   AST 33  --   GLUCOSE 204* 212*    Lipid Panel  Lab Results  Component Value Date   CHOL 206 (H) 05/01/2010   HDL 34 (L) 05/01/2010   LDLCALC 149 (H) 05/01/2010   TRIG 117 05/01/2010    CHOLHDL 6.1 Ratio 05/01/2010    BNP (last 3 results) Recent Labs    02/24/2022 1130  BNP 965.7*    HEMOGLOBIN A1C Ronald [name] results found for: "HGBA1C", "MPG"  Cardiac Panel (last 3 results) Ronald [name] results for input(s): "CKTOTAL", "CKMB", "RELINDX" in the last 8760 hours.  Invalid input(s): "TROPONINHS"  Ronald [name] results found for: "CKTOTAL", "CKMB", "CKMBINDEX"   TSH Ronald [name] results for input(s): "TSH" in the last 8760 hours.    CARDIAC DATABASE: EKG: 03/02/2022: 11:30 AM: Wide-complex tachycardia, 203 bpm. 11:33 AM: Atrial paced ventricular sensed rhythm, intraventricular conduction delay, with frequent PVCs. 11:56 AM: Sinus, 93 bpm, right bundle branch block, left anterior fascicular block, diffuse nonspecific ST-T changes.  Echocardiogram: 02/28/2022 [facility]: The left ventricle is mildly dilated.  Left ventricular systolic function is moderately reduced.  LV ejection fraction = 30-35%.  Unable to fully assess LV regional wall motion  The right ventricle is not well visualized.  The left atrium is mildly to moderately dilated.  There is a bioprosthetic aortic valve.  There is mild mitral regurgitation.  There is mild tricuspid regurgitation.  Mild pulmonary hypertension.  The inferior vena cava was not visualized during the exam.  There is Ronald [name] pericardial effusion.   Heart Catheterization: 01/04/2022 [facility]: Coronary Angiography Findings:  --LM: Minimal luminal irregularities  --LAD: Minimal luminal irregularities  --LCx: Minimal luminal irregularities, anomalous origin from right cusp  --RCA: Minimal luminal irregularities  --AV crossed: LVEDP: 28 mmHg   IMPRESSION & RECOMMENDATIONS: GEAROLD PIER is a 51 y.o. Caucasian male whose past medical history and cardiovascular risk factors include: History of bicuspid aortic  valve status post aortic valvuloplasty at the age of 40 and bioprosthetic aortic  valve implantation and aortic root replacement (June 2023), anomalous circumflex, Biotronik pacemaker implantation July 2023, acute on chronic HFrEF, mixed hyperlipidemia, intellectual disability, chronic bilateral lower extremity swelling, obesity due to excess calories.  Impression: Ventricular tachycardia. Acute combined systolic and diastolic heart failure Acute kidney injury Status post aortic valve replacement with aortic root replacement June 2023 Status post Biotronik pacemaker implantation July 2023 Hyperlipidemia. 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  Since then he remains in normal sinus rhythm and hemodynamically stable not requiring pressor support.  We will start him on IV amiodarone given the wide-complex tachycardia and cardiomyopathy and heart failure history.  We will start him on IV heparin drip, trend troponins, we discussed undergoing left heart catheterization with both the patient and his stepmother [name] who is the patient's POA.  However, given the acute kidney injury, Ronald [name] anginal discomfort, and recent  left heart catheterization in May 2023 the shared decision was to treat him medically and once renal function improves consider heart catheterization.  We will have to obtain records from [facility] with regards to the operative report to see if bypass was performed as EMR notes SVG harvesting.  We will consult EP for further evaluation and management of wide-complex tachycardia given his comorbid conditions.  We will need to interrogate the pacemaker as well.  Strict I's and O's,  daily weights. Start IV Lasix and Farxiga 10 mg p.o. daily. We will restart home medications and hold nephrotoxic agents. Monitor BUN and creatinine. Goal is to keep MAP greater than 70 mmHg. We will transfer him to CVICU. Check Echo  Check CMP, BMP, trend troponins, fasting lipid profile, lactic acid.  Check chest x-ray, UA, blood cultures if temp >100.4.   Consultants:  Cardiac electrophysiology-wide-complex tachycardia evaluated management   Code Status: Full code (patient's POA will discuss with patient and update provider/nursing staff)   Family Communication: Spoke to the patient's POA [name] was present at bedside   Disposition Plan: Pending   CRITICAL CARE Performed by: Rex Kras   Total critical care time: 48 minutes   Critical care time was exclusive of separately billable procedures and treating other patients.   Critical care was necessary to treat or prevent imminent or life-threatening deterioration.   Critical care was time spent personally by me on the following activities given his WCT and acute heart failure: development of treatment plan with patient and/or surrogate as well as nursing, discussions with ED provider Dr. Regenia Skeeter, interventional cardiologist Dr. Vernell Leep, evaluation of patient's response to treatment, examination of patient, obtaining history from patient his POA, ordering and performing treatments and interventions, ordering and review of laboratory studies, ordering and review of radiographic studies, pulse oximetry and re-evaluation of patient's condition.  This note was created using a voice recognition software as a result there may be grammatical errors inadvertently enclosed that do not reflect the nature of this encounter. Every attempt is made to correct such errors.  Mechele Claude [facility]  Pager: [phone] Office: [phone] 02/27/2022, 1:33 PM

## 2022-03-11 NOTE — ED Notes (Signed)
Attempted to interrogate pt's pacemaker x3 without success. MD notified.

## 2022-03-11 NOTE — Consult Note (Addendum)
Cardiology Consultation:   Patient ID: KASPIAN VONGPHACHANH MRN: [id number]; DOB: 1971/01/12  Admit date: 03/09/2022 Date of Consult: 03/02/2022  PCP:  Ronald Liberty, MD   [facility] Providers Cardiologist:     Patient Profile:   Ronald [name] is a 51 y.o. 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 Tolia.  History of Present Illness:   Ronald [name] chart was reviewed extensively by Dr. Terri Skains and HPI is obtained primarily from his H&P as well as personal chart review, the pt and his mom is at bedside.  He has hx of congenital bicuspid AV and at age 31 had some kind of intervention, sounds like a valvuloplasty, more recently only last month had aortic valve replacement with root repair that was done by Dr. 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 Terri Skains  called to see and admit, subsequently asking EP to come to the case as well given complexity of his case.   The patient reports that he has been feeling OK since home, no post op pain, went to bed fine last night, got up this AM feeling nauseous, threw up once, and felt better, made breakfast took one bite and his heart started racing, made him feel very SOB, weak, did not faint His mom mentions not peeing much if at all today, he can not recall, but otherwise reports he has been urintating "fine"  He is MARKEDLY edematous, they all report before surgery he had swelling, but less then now, and when he went home he had swelling probably at his baseline, again, less then now.  He is feeling much better currently  LABS K+ 3.7, 3.6 BUN 24, 23 Creat 2.29 > 2.30 (last Creat 03/01/22 was 1.48) Mag 2.0 BNP 965 HS Trop 440 WBC 12 H/H 11/33 Plts 363  He is on a hep gtt and amio gtt BP stable He has gottedb 120mg  of lasix so far IV And has gotten K+ IV 75meq x1  Past Medical History:  Diagnosis Date   Asthma     Past Surgical History:  Procedure Laterality Date   CARDIAC VALVE SURGERY     LITHOTRIPSY     TONSILLECTOMY       Home Medications:  Prior to Admission medications   Medication Sig Start Date End Date Taking? Authorizing Provider  ASPIRIN LOW DOSE 81 MG tablet Take 81 mg by mouth daily. 03/01/22  Yes [provider]  FEROSUL 325 (65  Fe) MG tablet Take 325 mg by mouth daily. 12/24/21  Yes [provider]  metoprolol succinate (TOPROL-XL) 25 MG 24 hr tablet Take 25 mg by mouth daily. 03/01/22  Yes [provider]  polyethylene glycol powder (GLYCOLAX/MIRALAX) 17 GM/SCOOP powder See admin instructions. Take 1 capful by mouth daily. 03/01/22  Yes [provider]  spironolactone (ALDACTONE) 25 MG tablet Take 25 mg by mouth daily. 03/01/22  Yes [provider]  torsemide (DEMADEX) 20 MG tablet Take 20 mg by mouth 2 (two) times daily. 03/01/22  Yes  [provider]  doxycycline (VIBRAMYCIN) 100 MG capsule Take 1 capsule (100 mg total) by mouth 2 (two) times daily. Patient not taking: Reported on 03/10/2022 12/04/21   Tomi Bamberger, PA-C  hydrochlorothiazide (HYDRODIURIL) 12.5 MG tablet Take 0.5 tablets (6.25 mg total) by mouth daily. Patient not taking: Reported on 03/13/2022 12/04/21 01/03/22  Tomi Bamberger, PA-C  HYDROcodone-acetaminophen (NORCO/VICODIN) 5-325 MG tablet Take 1-2 tablets by mouth every 6 (six) hours as needed. Patient not taking: Reported on 03/03/2022 01/21/18   Roxy Horseman, PA-C  predniSONE (DELTASONE) 20 MG tablet Take 2 tablets (40 mg total) by mouth daily. Patient not taking: Reported on 03/10/2022 01/21/18   Roxy Horseman, PA-C    Inpatient Medications: Scheduled Meds:  aspirin EC  81 mg Oral Daily   dapagliflozin propanediol  10 mg Oral Daily   furosemide  80 mg Intravenous Once   sodium chloride flush  3 mL Intravenous Q12H   Continuous Infusions:  sodium chloride     amiodarone 60 mg/hr (02/27/2022 1135)   Followed by   amiodarone     heparin 1,400 Units/hr (03/13/2022 1305)   PRN Meds: sodium chloride, acetaminophen, ondansetron (ZOFRAN) IV, sodium chloride flush  Allergies:   No Known Allergies  Social History:   Social History   Socioeconomic History   Marital status: Single    Spouse name: Not on file   Number of children: Not on file   Years of education: Not on file   Highest education level: Not on file  Occupational History   Not on file  Tobacco Use   Smoking status: Never   Smokeless tobacco: Never  Substance and Sexual Activity   Alcohol use: No   Drug use: No   Sexual activity: Not on file  Other Topics Concern   Not on file  Social History Narrative   Not on file   Social Determinants of Health   Financial Resource Strain: Not on file  Food Insecurity: Not on file  Transportation Needs: Not on file  Physical Activity: Not on file  Stress: Not on file   Social Connections: Not on file  Intimate Partner Violence: Not on file    Family History:   Family History  Problem Relation Age of Onset   COPD Mother    Cancer Father      ROS:  Please see the history of present illness.  All other ROS reviewed and negative.     Physical Exam/Data:   Vitals:   02/27/2022 1345 03/10/2022 1400 02/25/2022 1415 03/21/2022 1430  BP: 110/74 107/76 109/80 115/76  Pulse: 65 79 78 78  Resp: (!) 29 (!) 27 (!) 26 (!) 25  Temp:      TempSrc:      SpO2: 100% 100% 100% 100%  Weight:      Height:       No intake or output data in the 24 hours ending 03/01/2022 1456  02/26/2022   12:40 PM 01/20/2018    8:44 PM 07/18/2015    9:24 PM  Last 3 Weights  Weight (lbs) 324 lb 1.2 oz 326 lb 286 lb  Weight (kg) 147 kg 147.873 kg 129.729 kg     Body mass index is 50.76 kg/m.  General:  Well nourished, well developed, in no acute distress, looks older then his age HEENT: normal Neck: no JVD Vascular: No carotid bruits; Distal pulses 2+ bilaterally Cardiac:  RRR; soft SM, no gallops or rubs Thoracotomy is healing well, CDT Lungs:  decreased at the bases Abd: soft, nontender, no hepatomegaly  NAT:FTDDUKG edema b/l LE Musculoskeletal:  No deformities Skin: warm and dry  Neuro:  CNs 2-12 intact, no focal abnormalities noted Psych:  Normal affect   EKG:  The EKG was personally reviewed and demonstrates:   #1 VT 203bpm #2 APaced w/PVCs #3/4, unclear underlying rhythm, regular, V rate 93, possibly junctional tach  Telemetry:  Telemetry was personally reviewed and demonstrates:  SR 70's  Relevant CV Studies:  Echocardiogram: 02/28/2022 [facility]: The left ventricle is mildly dilated.  Left ventricular systolic function is moderately reduced.  LV ejection fraction = 30-35%.  Unable to fully assess LV regional wall motion  The right ventricle is not well visualized.  The left atrium is mildly to moderately dilated.   There is a bioprosthetic aortic valve.  There is mild mitral regurgitation.  There is mild tricuspid regurgitation.  Mild pulmonary hypertension.  The inferior vena cava was not visualized during the exam.  There is no pericardial effusion.    Heart Catheterization: 01/04/2022 [facility]: Coronary Angiography Findings:  --LM: Minimal luminal irregularities  --LAD: Minimal luminal irregularities  --LCx: Minimal luminal irregularities, anomalous origin from right cusp  --RCA: Minimal luminal irregularities  --AV crossed: LVEDP: 28 mmHg   Laboratory Data:  High Sensitivity Troponin:   Recent Labs  Lab 03/10/2022 1130  TROPONINIHS 440*     Chemistry Recent Labs  Lab 03/15/2022 1130 03/13/2022 1137  NA 138 136  K 3.7 3.6  CL 97* 101  CO2 17*  --   GLUCOSE 204* 212*  BUN 24* 23*  CREATININE 2.29* 2.30*  CALCIUM 9.0  --   MG 2.0  --   GFRNONAA 34*  --   ANIONGAP 24*  --     Recent Labs  Lab 03/23/2022 1130  PROT 6.4*  ALBUMIN 2.8*  AST 33  ALT 32  ALKPHOS 67  BILITOT 0.7   Lipids No results for input(s): "CHOL", "TRIG", "HDL", "LABVLDL", "LDLCALC", "CHOLHDL" in the last 168 hours.  Hematology Recent Labs  Lab 03/15/2022 1130 03/17/2022 1137  WBC 12.0*  --   RBC 3.92*  --   HGB 10.1* 11.2*  HCT 34.4* 33.0*  MCV 87.8  --   MCH 25.8*  --   MCHC 29.4*  --   RDW 17.3*  --   PLT 363  --    Thyroid No results for input(s): "TSH", "FREET4" in the last 168 hours.  BNP Recent Labs  Lab 03/13/2022 1130  BNP 965.7*    DDimer No results for input(s): "DDIMER" in the last 168 hours.   Radiology/Studies:  DG Chest Portable 1 View  Result Date: 02/23/2022 CLINICAL DATA:  Ventricular tachycardia.  Postop CABG 1 month ago. EXAM: PORTABLE CHEST 1 VIEW COMPARISON:  Radiographs 12/05/2021. FINDINGS: 1145 hours. Interval revised median sternotomy and left subclavian pacemaker placement with leads projecting  over the right atrium and right  ventricle. The heart is mildly enlarged. There is mild vascular congestion without overt pulmonary edema. No confluent airspace opacity, pleural effusion or pneumothorax. No acute osseous findings are evident. Telemetry leads overlie the chest. IMPRESSION: Interval median sternotomy and pacemaker placement. Cardiomegaly and vascular congestion without definite acute abnormality. Electronically Signed   By: Richardean Sale M.D.   On: 03/17/2022 11:56     Assessment and Plan:   VT Perhaps 2/2 acute CHF exacerbation in the environment of reduced EF Biotronik is bedside will check pacer  Agree with amio for now Management with Dr. Terri Skains Perhaps planned for cath pending renal function, clinical course with some post shock ST changes and uncertainty on coronary status post AVR  Keep K+ and mag replaced  Dr. Lovena Le will see later today    Risk Assessment/Risk Scores:    For questions or updates, please contact [facility] Please consult [email or web address] for contact info under    Signed, Baldwin Jamaica, PA-C  03/20/2022 2:56 PM  EP Attending  Patient seen and examined. Agree with above. The patient presents with sustained VT. He has a h/o aortic valve disease and is s/p AVR and aortic root replacement complicated by heart block. He was noted to have LV dysfunction and signficant volume overload. He presented with VT and has been placed on amiodarone. On exam he is significantly volume overloaded with peripheral edema. He denies chest pain. His exam demonstrates a RRR and scattered rales and 3+ edema. Neuro is non-focal.  Tele demonstrates NSR A/P VT - he will continue IV amiodarone. 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  Heart block - he is conducting today though amiodarone may affect his conduction.  CHF - with his renal failure, he may need ionotropes to help remove the peripheral edema. Consider consulting our advanced heart failure service.   Carleene Overlie  Lashayla Armes,MD

## 2022-03-11 NOTE — ED Triage Notes (Signed)
Patient BIB [facility] from home. Patient was seated on couch, eating breakfast when he said he felt a fluttering in his chest and felt his heart was racing. EMS placed patient on ekg monitor and patient discovered to be in vtach w/ HR in the 230s. Pt given 6mg  and 12 mg of adenosine with no change, along with 150 mg of amiodarone enroute with no change. When patient arrived, he became altered and unresponsive and EMS delivered a shock of 360 J for cardioversion unsuccessfully. Pt diaphoretic, nauseated, and starting to become more responsive upon entry into Trauma A. Dr. at bedside. See MAR for medications.

## 2022-03-11 NOTE — Progress Notes (Addendum)
51 y/o Caucasian male with known anomalous LCx, h/o bicuspid aortic valve s/p aortic valvuloplasty at age 73, re-do sternotomy with bioprosthetic AVR and aotic root repalcement (01/2022), PPM placement, acute on chronic HFrEF, presented with [facility] with VT  Known heart failure with no obstructive CAD on cath in 12/2021, acute kidney injury Cr 2.3, known anomalpous LCx. Post arrest EKG with no STEMI.  Most likely etiology of VT is HFrEF. RIsks of urgent coronary angiography outweighs the risks. Therefore, I do not recommend urgent coronary angiography.  Discussed with patient, mother, Drs. Foye Spurling at bedside.   Recommend admission for VT, HFrEF, EP consult.  CRITICAL CARE Performed by: Truett Mainland   Total critical care time: 30 minutes   Critical care time was exclusive of separately billable procedures and treating other patients.   Critical care was necessary to treat or prevent imminent or life-threatening deterioration.   Critical care was time spent personally by me on the following activities: development of treatment plan with patient and/or surrogate as well as nursing, discussions with consultants, evaluation of patient's response to treatment, examination of patient, obtaining history from patient or surrogate, ordering and performing treatments and interventions, ordering and review of laboratory studies, ordering and review of radiographic studies, pulse oximetry and re-evaluation of patient's condition.

## 2022-03-12 ENCOUNTER — Inpatient Hospital Stay (HOSPITAL_COMMUNITY): Payer: Medicare (Managed Care)

## 2022-03-12 DIAGNOSIS — R000 Tachycardia, unspecified: Secondary | ICD-10-CM | POA: Diagnosis not present

## 2022-03-12 LAB — COMPREHENSIVE METABOLIC PANEL
ALT: 1334 U/L — ABNORMAL HIGH (ref 0–44)
AST: 2127 U/L — ABNORMAL HIGH (ref 15–41)
Albumin: 2.6 g/dL — ABNORMAL LOW (ref 3.5–5.0)
Alkaline Phosphatase: 70 U/L (ref 38–126)
Anion gap: 19 — ABNORMAL HIGH (ref 5–15)
BUN: 29 mg/dL — ABNORMAL HIGH (ref 6–20)
CO2: 23 mmol/L (ref 22–32)
Calcium: 8.9 mg/dL (ref 8.9–10.3)
Chloride: 95 mmol/L — ABNORMAL LOW (ref 98–111)
Creatinine, Ser: 2.83 mg/dL — ABNORMAL HIGH (ref 0.61–1.24)
GFR, Estimated: 26 mL/min — ABNORMAL LOW (ref >60)
Glucose, Bld: 125 mg/dL — ABNORMAL HIGH (ref 70–99)
Potassium: 4.7 mmol/L (ref 3.5–5.1)
Sodium: 137 mmol/L (ref 135–145)
Total Bilirubin: 1.7 mg/dL — ABNORMAL HIGH (ref 0.3–1.2)
Total Protein: 6.1 g/dL — ABNORMAL LOW (ref 6.5–8.1)

## 2022-03-12 LAB — LIPID PANEL
Cholesterol: 109 mg/dL (ref 0–200)
HDL: 25 mg/dL — ABNORMAL LOW (ref >40)
LDL Cholesterol: 67 mg/dL (ref 0–99)
Total CHOL/HDL Ratio: 4.4 RATIO
Triglycerides: 83 mg/dL (ref <150)
VLDL: 17 mg/dL (ref 0–40)

## 2022-03-12 LAB — CBC
HCT: 32 % — ABNORMAL LOW (ref 39.0–52.0)
Hemoglobin: 9.7 g/dL — ABNORMAL LOW (ref 13.0–17.0)
MCH: 25.5 pg — ABNORMAL LOW (ref 26.0–34.0)
MCHC: 30.3 g/dL (ref 30.0–36.0)
MCV: 84 fL (ref 80.0–100.0)
Platelets: 325 10*3/uL (ref 150–400)
RBC: 3.81 MIL/uL — ABNORMAL LOW (ref 4.22–5.81)
RDW: 17.4 % — ABNORMAL HIGH (ref 11.5–15.5)
WBC: 15.3 10*3/uL — ABNORMAL HIGH (ref 4.0–10.5)
nRBC: 0 % (ref 0.0–0.2)

## 2022-03-12 LAB — POCT I-STAT 7, (LYTES, BLD GAS, ICA,H+H)
Acid-base deficit: 3 mmol/L — ABNORMAL HIGH (ref 0.0–2.0)
Bicarbonate: 26.7 mmol/L (ref 20.0–28.0)
Calcium, Ion: 1.08 mmol/L — ABNORMAL LOW (ref 1.15–1.40)
HCT: 33 % — ABNORMAL LOW (ref 39.0–52.0)
Hemoglobin: 11.2 g/dL — ABNORMAL LOW (ref 13.0–17.0)
O2 Saturation: 94 %
Patient temperature: 36.3
Potassium: 4.1 mmol/L (ref 3.5–5.1)
Sodium: 136 mmol/L (ref 135–145)
TCO2: 29 mmol/L (ref 22–32)
pCO2 arterial: 72.2 mmHg (ref 32–48)
pH, Arterial: 7.172 — CL (ref 7.35–7.45)
pO2, Arterial: 87 mmHg (ref 83–108)

## 2022-03-12 LAB — LACTIC ACID, PLASMA
Lactic Acid, Venous: 3.8 mmol/L (ref 0.5–1.9)
Lactic Acid, Venous: 4 mmol/L (ref 0.5–1.9)

## 2022-03-12 LAB — BRAIN NATRIURETIC PEPTIDE: B Natriuretic Peptide: 460.1 pg/mL — ABNORMAL HIGH (ref 0.0–100.0)

## 2022-03-12 LAB — LDL CHOLESTEROL, DIRECT: Direct LDL: 69 mg/dL (ref 0–99)

## 2022-03-12 LAB — MAGNESIUM: Magnesium: 2 mg/dL (ref 1.7–2.4)

## 2022-03-12 LAB — GLUCOSE, CAPILLARY: Glucose-Capillary: 118 mg/dL — ABNORMAL HIGH (ref 70–99)

## 2022-03-12 MED ORDER — KETAMINE HCL 50 MG/5ML IJ SOSY
PREFILLED_SYRINGE | INTRAMUSCULAR | Status: DC
Start: 2022-03-12 — End: 2022-03-12
  Filled 2022-03-12: qty 5

## 2022-03-12 MED ORDER — FENTANYL CITRATE PF 50 MCG/ML IJ SOSY
PREFILLED_SYRINGE | INTRAMUSCULAR | Status: AC
  Administered 2022-03-12: 100 ug
  Filled 2022-03-12: qty 2

## 2022-03-12 MED ORDER — FENTANYL 2500MCG IN NS 250ML (10MCG/ML) PREMIX INFUSION
0.0000 ug/h | INTRAVENOUS | Status: DC
  Administered 2022-03-12: 200 ug/h via INTRAVENOUS
  Filled 2022-03-12: qty 250

## 2022-03-12 MED ORDER — FUROSEMIDE 10 MG/ML IJ SOLN
120.0000 mg | Freq: Once | INTRAVENOUS | Status: AC
  Administered 2022-03-12: 120 mg via INTRAVENOUS
  Filled 2022-03-12: qty 2

## 2022-03-12 MED ORDER — MAGNESIUM SULFATE 4 GM/100ML IV SOLN
INTRAVENOUS | Status: AC
  Filled 2022-03-12: qty 100

## 2022-03-12 MED ORDER — GLYCOPYRROLATE 0.2 MG/ML IJ SOLN: 0.2000 mg | INTRAMUSCULAR | Status: DC | PRN

## 2022-03-12 MED ORDER — SUCCINYLCHOLINE CHLORIDE 200 MG/10ML IV SOSY
PREFILLED_SYRINGE | INTRAVENOUS | Status: AC
  Filled 2022-03-12: qty 10

## 2022-03-12 MED ORDER — MIDAZOLAM HCL 2 MG/2ML IJ SOLN
INTRAMUSCULAR | Status: AC
  Administered 2022-03-12: 2 mg
  Filled 2022-03-12: qty 2

## 2022-03-12 MED ORDER — ROCURONIUM BROMIDE 10 MG/ML (PF) SYRINGE
PREFILLED_SYRINGE | INTRAVENOUS | Status: AC
  Administered 2022-03-12: 100 mg
  Filled 2022-03-12: qty 10

## 2022-03-12 MED ORDER — LIDOCAINE IN D5W 4-5 MG/ML-% IV SOLN
1.0000 mg/min | INTRAVENOUS | Status: DC
  Administered 2022-03-12: 1 mg/min via INTRAVENOUS
  Filled 2022-03-12: qty 500

## 2022-03-12 MED ORDER — EPINEPHRINE HCL 5 MG/250ML IV SOLN IN NS
5.0000 ug/min | INTRAVENOUS | Status: DC
  Administered 2022-03-12: 5 ug/min via INTRAVENOUS
  Filled 2022-03-12: qty 250

## 2022-03-12 MED ORDER — NOREPINEPHRINE 4 MG/250ML-% IV SOLN
INTRAVENOUS | Status: AC
  Filled 2022-03-12: qty 250

## 2022-03-12 MED ORDER — DEXMEDETOMIDINE HCL IN NACL 400 MCG/100ML IV SOLN
INTRAVENOUS | Status: AC
  Filled 2022-03-12: qty 100

## 2022-03-12 MED ORDER — ETOMIDATE 2 MG/ML IV SOLN
INTRAVENOUS | Status: DC
Start: 2022-03-12 — End: 2022-03-12
  Filled 2022-03-12: qty 20

## 2022-03-12 MED ORDER — AMIODARONE IV BOLUS ONLY 150 MG/100ML
INTRAVENOUS | Status: AC
  Administered 2022-03-12: 150 mg
  Filled 2022-03-12: qty 100

## 2022-03-12 MED ORDER — MILRINONE LACTATE IN DEXTROSE 20-5 MG/100ML-% IV SOLN
0.2500 ug/kg/min | INTRAVENOUS | Status: DC
  Administered 2022-03-12: 0.25 ug/kg/min via INTRAVENOUS
  Filled 2022-03-12: qty 100

## 2022-03-12 MED ORDER — LIDOCAINE BOLUS VIA INFUSION
100.0000 mg | Freq: Once | INTRAVENOUS | Status: AC
  Administered 2022-03-12: 100 mg via INTRAVENOUS
  Filled 2022-03-12: qty 100

## 2022-03-12 MED ORDER — ETOMIDATE 2 MG/ML IV SOLN
INTRAVENOUS | Status: AC
  Filled 2022-03-12: qty 10

## 2022-03-12 MED ORDER — ROCURONIUM BROMIDE 10 MG/ML (PF) SYRINGE
PREFILLED_SYRINGE | INTRAVENOUS | Status: AC
  Filled 2022-03-12: qty 10

## 2022-03-12 MED ORDER — GLYCOPYRROLATE 1 MG PO TABS: 1.0000 mg | ORAL_TABLET | ORAL | Status: DC | PRN

## 2022-03-12 MED ORDER — ETOMIDATE 2 MG/ML IV SOLN
INTRAVENOUS | Status: AC
  Administered 2022-03-12: 20 mg
  Filled 2022-03-12: qty 20

## 2022-03-12 MED ORDER — CHLORHEXIDINE GLUCONATE CLOTH 2 % EX PADS
6.0000 | MEDICATED_PAD | Freq: Every day | CUTANEOUS | Status: DC
Start: 2022-03-12 — End: 2022-03-12

## 2022-03-12 MED ORDER — MIDAZOLAM HCL 2 MG/2ML IJ SOLN: 2.0000 mg | INTRAMUSCULAR | Status: DC | PRN

## 2022-03-12 MED ORDER — SUCCINYLCHOLINE CHLORIDE 200 MG/10ML IV SOSY
PREFILLED_SYRINGE | INTRAVENOUS | Status: DC
Start: 2022-03-12 — End: 2022-03-12
  Filled 2022-03-12: qty 10

## 2022-03-26 NOTE — Consult Note (Signed)
WOC consulted for "multiple wounds" patient has coded and has been made a DNR and transition to comfort care. Will not consult for that reason. [place] with the bedside nurse for any needs related to wound care and topical care.    Re consult if needed, will not follow at this time. Thanks  Nathalya Wolanski [employer], RN,CWOCN, CNS, CWON-AP [phone])

## 2022-03-26 NOTE — Progress Notes (Addendum)
I was contacted this morning regarding ongoing VT storm and possibility of mechanical support.  Patient remains in sustained VT and outputs of 220 bpm, with a very thready pulse and nonsustainable blood pressure.  Patient has longstanding cardiac history as has been previously described with recent surgical aortic valve replacement 2 months ago, EF 30 to 35%.  VT in the setting of acute on chronic systolic heart failure.  In the setting, patient does not have any chance of survival without mechanical support.  I spoke with patient's mother Ronald [name], who also happens to be my patient.  I specifically asked her if she would want [place] to do aggressive management including mechanical circulatory support, versus pursuing comfort care measures.  Ronald [name] was unequivocal and expressing reviews that she does not want any more aggressive invasive management, knowing that this would likely mean no survival.  Respecting mother's wishes, we made the decision to not proceed with mechanical support.  Patient remains on lidocaine and amiodarone drips, in sustained VT with maps <40 mmHg.  We will continue current drips, ventilatory support, but not escalate care and DNR.  CRITICAL CARE Performed by: Truett Mainland   Total critical care time: 35 minutes   Critical care time was exclusive of separately billable procedures and treating other patients.   Critical care was necessary to treat or prevent imminent or life-threatening deterioration.   Critical care was time spent personally by me on the following activities: development of treatment plan with patient and/or surrogate as well as nursing, discussions with consultants, evaluation of patient's response to treatment, examination of patient, obtaining history from patient or surrogate, ordering and performing treatments and interventions, ordering and review of laboratory studies, ordering and review of radiographic studies, pulse oximetry and re-evaluation of  patient's condition.

## 2022-03-26 NOTE — Progress Notes (Addendum)
RN given orders by Dr. Heywood Footman that Amiodarone can be turned to 60 mg from 30 mg for multiple VT occurences.

## 2022-03-26 NOTE — Death Summary Note (Addendum)
DEATH SUMMARY   Patient Details  Name: Ronald [name] MRN: [id number] DOB: Oct 28, 1970  Admission/Discharge Information   Admit Date:  02/27/2022  Date of Death: Date of Death: 2022/03/13  Time of Death: Time of Death: 0943  Length of Stay: 1  PCP: Corine Shelter, MD   Reason(s) for Hospitalization  Ventricular tachycardia  Diagnoses  Preliminary cause of death:  Electrical storm -sustained monomorphic VT  Secondary Diagnoses (including complications and co-morbidities):  Cardiogenic shock. Shock liver. History of aortic valve replacement and root replacement History of pacemaker implantation. Hyperlipidemia. Intellectual disability. Chronic bilateral lower extremity venous stasis. Obesity due to excess calories   Brief Hospital Course (including significant findings, care, treatment, and services provided and events leading to death)  Ronald [name] is a 51 y.o. 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  Presented to the hospital with, wide-complex tachycardia evaluated by cardiology and electrophysiology.  He was placed on amiodarone drip and admitted to CVICU.   This morning patient started to have multiple monomorphic ventricular tachycardia which episodes, hypotension, shock liver. Patient subsequently required inotropic and vasopressor support.  Had recurrent monomorphic sustained ventricular tachycardia.  Defibrillated approximately 20 times despite being on IV amiodarone and lidocaine drips.  He coded and ROSC was achieved.  In addition, spoke to  advanced heart failure specialist who recommended mechanical support if family agreeable.  Course of events and recommendations from EP/heart failure service discussed with Ms. Dolores Management consultant (stepmother and POA) who requested comfort measures.  Patient's POA did not want to pursue mechanical support/aggressive measures.  CODE STATUS changed to DNR.  He was transitioned to comfort focused care.  Family present at bedside and requested terminal extubation.   Time of death 9:43 AM 2022-03-13.  Pertinent Labs and Studies  Significant Diagnostic Studies DG Chest Portable 1 View  Result Date: 02/25/2022 CLINICAL DATA:  Ventricular tachycardia.  Postop CABG 1 month ago. EXAM: PORTABLE CHEST 1 VIEW COMPARISON:  Radiographs 12/05/2021. FINDINGS: 1145 hours. Interval revised median sternotomy and left subclavian pacemaker placement with leads projecting over the right atrium and right ventricle. The heart is mildly enlarged. There is mild vascular congestion without overt pulmonary edema. No confluent airspace opacity, pleural effusion or pneumothorax. No acute osseous findings are evident. Telemetry leads overlie the chest. IMPRESSION: Interval median sternotomy and pacemaker placement. Cardiomegaly and vascular congestion without definite acute abnormality. Electronically Signed   By: Carey Bullocks M.D.   On: March 12, 2022 11:56    Microbiology Recent Results (from the past 240 hour(s))  MRSA Next Gen by PCR, Nasal     Status: None   Collection Time: 03/18/2022  7:32 PM   Specimen: Nasal Mucosa; Nasal Swab  Result Value Ref Range Status   MRSA by PCR Next Gen NOT DETECTED NOT DETECTED Final    Comment: (NOTE) The GeneXpert MRSA Assay (FDA approved for NASAL specimens only), is one component of a comprehensive MRSA colonization surveillance program. It is not intended to diagnose MRSA infection nor to guide or monitor treatment for MRSA infections. Test performance is not FDA approved in patients less  than 25 years old. Performed at [facility], [address]  [place], [place] [address]     Lab Basic Metabolic Panel: Recent Labs  Lab 02/24/2022 1130 03/13/2022 1137 03/17/2022 1430 03/19/2022 2115 March 18, 2022 0212  NA 138 136  --  135 137  K 3.7 3.6  --  4.9 4.7  CL 97* 101  --  95* 95*  CO2 17*  --   --  24 23  GLUCOSE 204* 212*  --  131* 125*  BUN 24* 23*  --  26* 29*  CREATININE 2.29* 2.30*  --  2.77* 2.83*  CALCIUM 9.0  --   --  8.8* 8.9  MG 2.0  --  2.1  --  2.0   Liver Function Tests: Recent Labs  Lab 03/03/2022 1130 18-Mar-2022 0212  AST 33 2,127*  ALT 32 1,334*  ALKPHOS 67 70  BILITOT 0.7 1.7*  PROT 6.4* 6.1*  ALBUMIN 2.8* 2.6*   No results for input(s): "LIPASE", "AMYLASE" in the last 168 hours. No results for input(s): "AMMONIA" in the last 168 hours. CBC: Recent Labs  Lab 03/16/2022 1130 03/04/2022 1137 03-18-2022 0212  WBC 12.0*  --  15.3*  NEUTROABS 8.3*  --   --   HGB 10.1* 11.2* 9.7*  HCT 34.4* 33.0* 32.0*  MCV 87.8  --  84.0  PLT 363  --  325   Cardiac Enzymes: No results for input(s): "CKTOTAL", "CKMB", "CKMBINDEX", "TROPONINI" in the last 168 hours. Sepsis Labs: Recent Labs  Lab 03/10/2022 1130 02/28/2022 1430 03/25/2022 1642 02/25/2022 2240 03-18-22 0212  WBC 12.0*  --   --   --  15.3*  LATICACIDVEN  --  3.7* 2.9* 3.8* 4.0*    Procedures/Operations  Intubation  Central line due Code blue.    Latoyia Tecson 03/18/22, 11:44 AM   ADDENDUM: Spoke to medical examiner Rayvon Char - not a medical examiner case.  Tessa Lerner, [place], [facility]  Pager: [phone] Office: [phone]

## 2022-03-26 NOTE — Procedures (Signed)
Arterial Catheter Insertion Procedure Note  ELIAN GLOSTER  [id number]  1971-08-15  Date:03/20/22  Time:7:43 AM    Provider Performing: Darcella Gasman Zanai Mallari    Procedure: Insertion of Arterial Line (63817) with US guidance (71165)   Indication(s) Blood pressure monitoring and/or need for frequent ABGs  Consent Unable to obtain consent due to emergent nature of procedure.  Anesthesia None   Time Out Verified patient identification, verified procedure, site/side was marked, verified correct patient position, special equipment/implants available, medications/allergies/relevant history reviewed, required imaging and test results available.   Sterile Technique Maximal sterile technique was not able to be achieved due to emergent nature of procedure.   Procedure Description Area of catheter insertion was cleaned with chlorhexidine and draped in sterile fashion. With real-time ultrasound guidance an arterial catheter was placed into the right femoral artery.  Appropriate arterial tracings confirmed on monitor.     Complications/Tolerance None; patient tolerated the procedure well.   EBL Minimal   Specimen(s) None  Darcella Gasman Matheau [place], PA-C

## 2022-03-26 NOTE — Plan of Care (Addendum)
Multiple salvos of NS MMVT within close occurrence of each other lasting up to 15 beats each. Provoked by stimulation. Will start IV lidocaine bolus and gtt at 1/min. Continue to monitor  New shock liver. LA persistent elevation to 4. MAP drifting down to low 60s. Will given continuous dose epinephrine 5 mcg as inotrope and pressor, rebolus lasix after epi on board

## 2022-03-26 NOTE — Consult Note (Addendum)
NAME:  Ronald [name], MRN:  [id number], DOB:  September 25, 1970, LOS: 1 ADMISSION DATE:  03/04/2022, CONSULTATION DATE:  2022-04-02 REFERRING MD:  Terri Skains, CHIEF COMPLAINT:  Palpitations  History of Present Illness:  51 yo initially admitted 7/17 for palpitations, vtach.   Given amio and electrically cardioverted in ED.  AVR at [facility] 1 month ago.   Blood pressure has been drifting downward.   Started on epinephrine this am.  I/o -40 (uop 725 x 24 hours) Amio given 7/17 Lasix x 2  Heparin Lidocaine gtt started  Milrinone started at midnight  This evening at 1 am increased amio to 60   Ongoing vtach episodes, so patient was changed to lidocaine.   Dr. Terri Skains consulted me for central line needs, shock evaluation, worsening lactic acidosis, transaminitis.   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  During the valve replacement surgery it appears the SVG was harvested not sure if he underwent bypass surgery.  We will have to obtain records for clarity.  Neither the patient nor Ronald [name] are aware of any bypass surgery.  02/28/2022 [facility]: The left ventricle is mildly dilated.  Left ventricular systolic function is moderately reduced.  LV ejection fraction = 30-35%.  Unable to fully assess LV regional wall motion  The right ventricle is not well visualized.  The left atrium is mildly to moderately dilated.  There is a bioprosthetic aortic valve.  There is mild mitral regurgitation.  There is mild tricuspid regurgitation.  Mild pulmonary hypertension.  The inferior vena cava was not visualized during the exam.   There is no pericardial effusion.    Heart Catheterization: 01/04/2022 [facility]: Coronary Angiography Findings:  --LM: Minimal luminal irregularities  --LAD: Minimal luminal irregularities  --LCx: Minimal luminal irregularities, anomalous origin from right cusp  --RCA: Minimal luminal irregularities  --AV crossed: LVEDP: 28 mmHg    Significant Hospital Events: Including procedures, antibiotic start and stop dates in addition to other pertinent events     Interim History / Subjective:    Objective   Blood pressure (!) 94/55, pulse 94, temperature 98.8 F (37.1 C), temperature source Oral, resp. rate (!) 25, height 5\' 7"  (1.702 m), weight (!) 147 kg, SpO2 (!) 87 %.        Intake/Output Summary (Last 24 hours) at April 02, 2022 0440 Last data filed at 04-02-22 0400 Gross per 24 hour  Intake 681.17 ml  Output 725 ml  Net -43.83 ml   Filed Weights   03/11/22 1240  Weight: (!) 147 kg    Examination: General: Pleasant, diaphoretic.  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   Plan from EP today - found him  volume overloaded VT - he will continue IV amiodarone. 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  Heart block -  he is conducting today though amiodarone may affect his conduction.  CHF - with his renal failure, he may need ionotropes to help remove the peripheral edema. Consider consulting our advanced heart failure service.   Overall, vtach thought 2/2 CHF exacerbation, patient started on lasix and amiodarone.   Consider RH failure.   Anion gap has actually improved from this am Trop had gon up a bit BNP  down  Ca 8.9 K 4.7  My brief bedside echo showed poor images due to body habitus, but no pericardial effusion seen on my apical 4 chamber (only image attainable).  Also notable AKI on CKD, transaminitis.  US liver when stable.   Shock appears cardiac in nature due to tachycardia and chf.  Hx and physical does not suggest sepsis.  Procal ordered.  Consider adding antibiotics when line available.    Patient with recurrent vtach episodes with persistent hypotension (map in 50s down to 40s sometimes).  Initially shocked for Vtach with pulse, followed by torsades.  Lost consciousness, cpr briefly initiated and patient defibrillated again, successfully.   Given magnesium 4gm, then epinephrine stopped, milrinone stopped.  Continued lidocaine.  Ongoing persistent episodes of vtach, amiodarone added back (per cardiology management).  Stopped heparin due to need for impending central line.  Sedation attempted with versed and fentanyl but only minimally helpful.   Patient was persistently unstable with adequate iv access during my care, unable to place line due to his persistent episodes of vtach and need for shocks, as well as other more pressing needs.   Due to repeated need for cardioversion and vt storm, ultimately patient needed intubation for airway control and sedation given the need for recurrent shocks.   Intubated per my note.  Planned for central line when completed and more stabilized.    Patient continued to experience vtach storm.   Cardiology continued manage.  EP and advanced HF called and plan care managed by cardiology primarily.   Respiratory acidosis noted after intubation, increased RR from 12 to 28.    Patient eventually experienced persistent pulseless vtach and underwent acls and cpr for about 5 min.  Never able to shock him out of vtach, despite lidocaine and amiodarone.  Received epi x 2 and bicarb x 2.   Refer to cardiology documentation regarding decision making.  ROCS achieved with  vtach with pulse and hypotension.   Cardiology discussed with mother, and she decided patient would not want further cpr, nor would he want aggressive interventions such as ecmo.   Plan for cardiology to manage as comfort care due to refractory VT storm until arrival of mother, with ongoing current support measures, however no cpr or ongoing shocks.    Best Practice (right click and "Reselect all SmartList Selections" daily)   Labs   CBC: Recent Labs  Lab 03/21/2022 1130 03/03/2022 1137 04/10/22 0212  WBC 12.0*  --  15.3*  NEUTROABS 8.3*  --   --   HGB 10.1* 11.2* 9.7*  HCT 34.4* 33.0* 32.0*  MCV 87.8  --  84.0  PLT 363  --  325    Basic Metabolic Panel: Recent Labs  Lab 03/22/2022 1130 03/17/2022 1137 03/20/2022 1430 03/08/2022 2115 2022-04-10 0212  NA 138 136  --  135 137  K 3.7 3.6  --  4.9 4.7  CL 97* 101  --  95* 95*  CO2 17*  --   --  24 23  GLUCOSE 204* 212*  --  131* 125*  BUN 24* 23*  --  26* 29*  CREATININE 2.29* 2.30*  --  2.77* 2.83*  CALCIUM 9.0  --   --  8.8* 8.9  MG 2.0  --  2.1  --   --    GFR: Estimated Creatinine Clearance: 44 mL/min (A) (by C-G formula based on SCr of 2.83 mg/dL (H)). Recent Labs  Lab 03/22/2022 1130 03/09/2022 1430 03/19/2022 1642 03/14/2022 2240 03/26/22 0212  WBC 12.0*  --   --   --  15.3*  LATICACIDVEN  --  3.7* 2.9* 3.8* 4.0*    Liver Function Tests: Recent Labs  Lab 03/22/2022 1130 26-Mar-2022 0212  AST 33 2,127*  ALT 32 1,334*  ALKPHOS 67 70  BILITOT 0.7 1.7*  PROT 6.4* 6.1*  ALBUMIN 2.8* 2.6*   No results for input(s): "LIPASE", "AMYLASE" in the last 168 hours. No results for input(s): "AMMONIA" in the last 168 hours.  ABG    Component Value Date/Time   TCO2 20 (L) 03/09/2022 1137     Coagulation Profile: Recent Labs  Lab 03/22/2022 1130  INR 1.4*    Cardiac Enzymes: No results for input(s): "CKTOTAL", "CKMB", "CKMBINDEX", "TROPONINI" in the last 168 hours.  HbA1C: No results found for: "HGBA1C"  CBG: No results for  input(s): "GLUCAP" in the last 168 hours.  Review of Systems:   Negative aside from palpitations  Past Medical History:  He,  has a past medical history of Asthma.   Surgical History:   Past Surgical History:  Procedure Laterality Date   CARDIAC VALVE SURGERY     LITHOTRIPSY     TONSILLECTOMY       Social History:   reports that he has never smoked. He has never used smokeless tobacco. He reports that he does not drink alcohol and does not use drugs.   Family History:  His family history includes COPD in his mother; Cancer in his father.   Allergies No Known Allergies   Home Medications  Prior to Admission medications   Medication Sig Start Date End Date Taking? Authorizing Provider  ASPIRIN LOW DOSE 81 MG tablet Take 81 mg by mouth daily. 03/01/22  Yes [provider]  FEROSUL 325 (65 Fe) MG tablet Take 325 mg by mouth daily. 12/24/21  Yes [provider]  metoprolol succinate (TOPROL-XL) 25 MG 24 hr tablet Take 25 mg by mouth daily. 03/01/22  Yes [provider]  polyethylene glycol powder (GLYCOLAX/MIRALAX) 17 GM/SCOOP powder See admin instructions. Take 1 capful by mouth daily. 03/01/22  Yes [provider]  spironolactone (ALDACTONE) 25 MG tablet Take 25 mg by mouth daily. 03/01/22  Yes [provider]  torsemide (DEMADEX) 20 MG tablet Take 20 mg by mouth 2 (two) times daily. 03/01/22  Yes [provider]  doxycycline (VIBRAMYCIN) 100 MG capsule Take 1 capsule (100 mg total) by mouth 2 (two) times daily. Patient not taking: Reported on 03/21/2022 12/04/21   Tomi Bamberger, PA-C  hydrochlorothiazide (HYDRODIURIL) 12.5 MG tablet Take 0.5 tablets (6.25 mg total) by mouth daily. Patient not taking: Reported on 03/02/2022 12/04/21 01/03/22  Tomi Bamberger, PA-C  HYDROcodone-acetaminophen (NORCO/VICODIN) 5-325 MG tablet Take 1-2 tablets by mouth every 6 (six) hours as needed. Patient not taking: Reported on 03/01/2022 01/21/18   Roxy Horseman, PA-C  predniSONE (DELTASONE) 20 MG tablet Take 2 tablets (40 mg total) by mouth daily. Patient not taking: Reported on 02/26/2022 01/21/18   Roxy Horseman, PA-C     Critical care time: 75 min

## 2022-03-26 NOTE — Progress Notes (Signed)
RN made aware the Dr. Odis Hollingshead put in consult for CCM to come place a CVC around 0440. Dr. Marcos Eke arrived shortly after speaking to [facility], at 0500 noticed patient having increased runs of sustained VT.  Dr. Odis Hollingshead was updated about rhythm and was advised to come bedside. Sustained VT was noted, 2 shocks were administered and he converted out of it.  Code blue was initiated at 0613, Dr. Odis Hollingshead was present at beside.  A total of 4mg  of Versed was given to sedate and comfort patient through multiple shocks. ROSC was achieved but remained in VT. Patient was intubated for airway protection with Fent, 20mg  etomidate, and 100 of roc. Mother was updated by Dr. , mother wished to cease CPR.  Patient was given a total of 300 mcg of fentanyl for pain/comfort.  Family is now bedside. See code sheet, MAR, and flow sheet for more documentation on events.

## 2022-03-26 NOTE — Progress Notes (Signed)
Chaplain responded to Code Blue.  Family not present but notified.  She will refer family for support to daytime chaplain staff. Rev. Lynnell Chad Pager [phone]

## 2022-03-26 NOTE — Progress Notes (Signed)
I responded this morning to bedside with recurrent and refractory VT. Amiodarone and lidocaine infusions already running.  Multiple amiodarone boluses been given.  Electrolyte repletion has previously been given.  He was prophylactically intubated for airway protection given concern for recurrent arrhythmias requiring defibrillation. His mother was updated by Drs. Tolia and Patwardin this morning. Code status was changed to DNR and he was transitioned to comfort focused care.  His mother requested terminal extubation to promote comfort.  Withdrawal of care orders placed.  Patient remains minimally responsive.  This patient is critically ill with multiple organ system failure which requires frequent high complexity decision making, assessment, support, evaluation, and titration of therapies. This was completed through the application of advanced monitoring technologies and extensive interpretation of multiple databases. During this encounter critical care time was devoted to patient care services described in this note for 30 minutes.  Steffanie Dunn, DO 03/05/2022 9:05 AM [facility]

## 2022-03-26 NOTE — Procedures (Signed)
Intubation Procedure Note  Ronald [name]  [id number]  1970-08-29  Date:03/16/2022  Time:8:38 AM   Provider Performing:Zion Lint Marcos Eke    Procedure: Intubation (31500)  Indication(s) Respiratory Failure  Consent Unable to obtain consent due to emergent nature of procedure.   Anesthesia Etomidate, Fentanyl, and Rocuronium   Time Out Verified patient identification, verified procedure, site/side was marked, verified correct patient position, special equipment/implants available, medications/allergies/relevant history reviewed, required imaging and test results available.   Sterile Technique Usual hand hygeine, masks, and gloves were used   Procedure Description Patient positioned in bed supine.  Sedation given as noted above.  Patient was intubated with endotracheal tube using Glidescope.  View was Grade 1 full glottis .  Number of attempts was  2 .  Colorimetric CO2 detector was consistent with tracheal placement. Patient was still gagging on initial look ,removed glide, gave additional sedation (versed 2mg ), attempted again after about 20 seconds.  Patient developed episodes of vtach during the intubation.    Complications/Tolerance Cuff leak, tube exchanged over bougie.   Placement confirmed with glidescope.  Chest X-ray when patient is stable to verify placement.    EBL none   Specimen(s) None

## 2022-03-26 NOTE — Procedures (Signed)
Extubation Procedure Note  Patient Details:   Name: Ronald [name] DOB: 1971-03-10 MRN: [id number]   Airway Documentation:    Vent end date: 03/16/2022 Vent end time: 0855   Pt extubated per Withdrawal of Life Protocol  Carolan Shiver 03/18/2022, 9:58 AM

## 2022-03-26 NOTE — Progress Notes (Signed)
eLink Physician-Brief Progress Note Patient Name: Ronald [name] DOB: Feb 17, 1971 MRN: [id number]   Date of Service  03/06/2022  HPI/Events of Note  Patient with a complicated cardiac history admitted by cardiology for wide complex tachycardia with hemodynamic instability, requiring cardioversion, he had another episode of wide complex tachycardia resembling Torsades on arrival to his ICU room and required shocking x 2, he is receiving Magnesium sulfate gtt at this time. Cardiology is the primary attending physician with PCCM consulting.  eICU Interventions  New Patient Evaluation.        Thomasene Lot Takaya Hyslop 03/22/2022, 5:49 AM

## 2022-03-26 NOTE — Consult Note (Signed)
Consulted for AKI. Upon my arrival for consultation, code blue had been called. In VT storm. Discussed w/ Dr. Odis Hollingshead. Patient is now comfort care. Will hold off on consultation.  Anthony Sar, MD [facility]

## 2022-03-26 NOTE — Procedures (Signed)
Central Venous Catheter Insertion Procedure Note  Ronald [name]  [id number]  [birth year]  Date:03/09/2022  Time:7:43 AM   Provider Performing:Dalexa Gentz R Leda [place]uille   Procedure: Insertion of Non-tunneled Central Venous Catheter(36556) with US guidance (52841)   Indication(s) Medication administration  Consent Unable to obtain consent due to emergent nature of procedure.  Anesthesia none  Timeout Verified patient identification, verified procedure, site/side was marked, verified correct patient position, special equipment/implants available, medications/allergies/relevant history reviewed, required imaging and test results available.  Sterile Technique Maximal sterile technique was not able to be achieved due to emergent nature of procedure.  Procedure Description Area of catheter insertion was cleaned with chlorhexidine and draped in sterile fashion.  With real-time ultrasound guidance a central venous catheter was placed into the right femoral vein. Nonpulsatile blood flow and easy flushing noted in all ports.  The catheter was sutured in place and sterile dressing applied.  Complications/Tolerance None; patient tolerated the procedure well. Chest X-ray is ordered to verify placement for internal jugular or subclavian cannulation.   Chest x-ray is not ordered for femoral cannulation.  EBL Minimal  Specimen(s) None   Ronald [name] [name], PA-C

## 2022-03-26 NOTE — Progress Notes (Signed)
ON-CALL CARDIOLOGY 2022/03/31  Patient's name: Ronald [name].   MRN: [id number].    DOB: June 21, 1971 Primary care provider: Corine Shelter, MD. Primary cardiologist: Dr. Theron Arista regarding this patient's care today: Yesterday night he was maintaining a MAP>34mmHg but poor UOP and elevated Cr.   Started on [employer] given the reduced LVEF.   Morning labs reviewed - AKI, Shock Liver, and lactic acidosis.   Review of EMR notes salvos of NS MMVT. I was not notified. Started on Lidocaine (agree).   Impression: Acute decompensated heat failure Ventricular tachycardia.  AKI  Recommendations: Consult CCM - will need central line and medical management. Spoke to Dr. Marcos Eke over the phone.  Will need to obtain Co-ox, CVP, and start Primacor and Levo.  Goal will be to maintain Co-ox >60% and MAP >29mmHG. Will consult advance heart failure team regarding acute decompensated heart failure.  Will consult nephrology RE: AKI. Spoke to Dr. Neldon Mc out to RN to discuss plan of care.   Telephone encounter total time: 20 minutes.   Tessa Lerner, [place], [facility]  Pager: [phone] Office: [phone]

## 2022-03-26 NOTE — Progress Notes (Signed)
RN had been in contact with Dr. Odis Hollingshead since 2229 03/08/2022 regarding patient status.  Was told to hold off on Central access and also started milrinone.  Given orders to increase Amio to 60 mg if more episodes of VT returned.  Rn contacted Dr. Patsi Sears regarding increased runs, also wasn't aware of the different cardiology services ,but was given orders to start lidocaine.  Informed Dr. Patsi Sears about lab work and consistent decreased BP, was given orders for Epi.  RN asked for central access with given IV medications and didn't receive any new orders. Epi was started at 0446, soon after spoke with Dr. Odis Hollingshead and updated about patient.  Order for CCM to come place CVC access was ordered and Elink was notified about Dr. Odis Hollingshead wanting the CVC stat for labs and medication.  Dr. Marcos Eke came to bedside to evaluate patient.

## 2022-03-26 NOTE — Progress Notes (Signed)
ON-CALL CARDIOLOGY 03/08/2022  Patient's name: Ronald [name].   MRN: [id number].    DOB: December 02, 1970 Primary care provider: Corine Shelter, MD. Primary cardiologist: Dr. Theron Arista regarding this patient's care today: He was having wide-complex tachycardia consistent with ventricular tachycardia despite being on amiodarone and lidocaine drips. Present at bedside by 5:55am.   He was defibrillated at least twice before I was present at bedside.  Patient was on amiodarone, lidocaine, and required recurrent defibrillation greater than 10 times.  Called EP who recommended to rebolus amiodarone and if able to increase lidocaine and consider mechanical support.  She decision between his critical care medicine and cardiology was to electively intubate the patient and sedate him given the electrical storm.  Patient was intubated by CCM.   Continue to be in VT storm -despite bolusing with IV amiodarone, up titration of lidocaine, & refractory to defibrillation.  Patient coded and achieved ROSC- see code sheet.   Central lines and a lines placed.  EP present at baseline and no additional recommendations.   Interventional cardiologist also present at bedside and spoke to the patient's stepmother and POA.   Spoke to and updated Dr. Loraine Leriche regarding the course of events.   His stepmother/POA Tylin Force was contacted and reviewed the course of events.  She expressed no additional aggressive measures and code status changed to DNR.   Patient remains in critical condition and very poor prognosis.   Remains on lidocaine, amiodarone drips and pressor support.  Impression: VT Strom  Acute Decompensated heart failure  Cardiogenic shock.  Shock Liver S/p AVR and Root replacement.   Recommendations: Continue current management.  CCM following and their assistance appreciated.  Spoke to CCM, interventional cardiology, EP, and heart failure teams.  Family coming in to visit  him.  Code Status changed to DNR.  Will discuss transition to comfort measures when patient's family comes to  bedside.   CRITICAL CARE Performed by: Tessa Lerner   Total critical care time: 72 minutes   Critical care time was exclusive of separately billable procedures and treating other patients.   Critical care was necessary to treat or prevent imminent or life-threatening deterioration due to VT strom and cardiogenic shock.   Critical care was time spent personally by me on the following activities: development of treatment plan with patient and/or surrogate as well as nursing, discussions with interventional cardiology/electrophysiology/advanced heart failure/critical care medicine, evaluation of patient's response to treatment of ventricular storm, examination of patient, obtaining history from patient or surrogate (his POA), ordering and performing treatments and interventions, ordering and review of laboratory studies, ordering and review of radiographic studies, pulse oximetry and re-evaluation of patient's condition.  Tessa Lerner, [place], [facility]  Pager: [phone] Office: [phone]

## 2022-03-26 NOTE — Progress Notes (Signed)
2D echo attempted, but patient has passed per RN.

## 2022-03-26 NOTE — Progress Notes (Signed)
   03/19/2022 0910  Clinical Encounter Type  Visited With Family;Health care provider  Visit Type Initial;Spiritual support;Patient actively dying;Death;Critical Care  Referral From Nurse Luberta Mutter, RN)  Consult/Referral To Chaplain Melvenia Beam)  Recommendations End of Life - Transition to [place] Prayer;Emotional;Grief support   Chaplain responded to page to offer grief support to family of Mr. Imri "Lynann Bologna" Bernardo Heater who was transitioning to comfort care. Met with patient's mother, Ms. [place], sister, and brother-in-law at bedside. Chaplain provided meaningful presence, reflective listening, and grief support during transition to comfort care and through death of Mr. Alyea. At family's request we prayed for comfort and peace. Chaplain provided patient representative contact information to patient's mother. [address] [place], [place] [phone]

## 2022-03-26 DEATH — deceased

## 2022-03-27 MED FILL — Medication: Qty: 1 | Status: AC
